# Patient Record
Sex: Female | Born: 1965
Health system: Southern US, Community
[De-identification: ages and names within clinical notes are randomized; demographics above are authoritative.]

## PROBLEM LIST (undated history)

## (undated) HISTORY — PX: ROTATOR CUFF REPAIR: SHX139

---

## 2003-11-11 HISTORY — PX: AUGMENTATION MAMMAPLASTY: SUR837

## 2008-01-18 ENCOUNTER — Encounter: Admission: RE | Admit: 2008-01-18 | Discharge: 2008-01-18 | Payer: Self-pay | Admitting: Obstetrics and Gynecology

## 2009-03-06 ENCOUNTER — Encounter: Admission: RE | Admit: 2009-03-06 | Discharge: 2009-03-06 | Payer: Self-pay | Admitting: Obstetrics and Gynecology

## 2010-03-07 ENCOUNTER — Encounter: Admission: RE | Admit: 2010-03-07 | Discharge: 2010-03-07 | Payer: Self-pay | Admitting: Obstetrics and Gynecology

## 2011-03-17 ENCOUNTER — Other Ambulatory Visit: Payer: Self-pay | Admitting: Obstetrics and Gynecology

## 2011-03-17 DIAGNOSIS — Z1231 Encounter for screening mammogram for malignant neoplasm of breast: Secondary | ICD-10-CM

## 2011-03-25 ENCOUNTER — Ambulatory Visit
Admission: RE | Admit: 2011-03-25 | Discharge: 2011-03-25 | Disposition: A | Discharge status: Home / Self Care | Payer: 59 | Source: Ambulatory Visit | Attending: Obstetrics and Gynecology | Admitting: Obstetrics and Gynecology

## 2011-03-25 DIAGNOSIS — Z1231 Encounter for screening mammogram for malignant neoplasm of breast: Secondary | ICD-10-CM

## 2012-02-16 ENCOUNTER — Other Ambulatory Visit: Payer: Self-pay | Admitting: Obstetrics and Gynecology

## 2012-02-16 DIAGNOSIS — Z1231 Encounter for screening mammogram for malignant neoplasm of breast: Secondary | ICD-10-CM

## 2012-03-31 ENCOUNTER — Ambulatory Visit
Admission: RE | Admit: 2012-03-31 | Discharge: 2012-03-31 | Disposition: A | Discharge status: Home / Self Care | Payer: 59 | Source: Ambulatory Visit | Attending: Obstetrics and Gynecology | Admitting: Obstetrics and Gynecology

## 2012-03-31 DIAGNOSIS — Z1231 Encounter for screening mammogram for malignant neoplasm of breast: Secondary | ICD-10-CM

## 2012-04-02 ENCOUNTER — Other Ambulatory Visit: Payer: Self-pay | Admitting: Obstetrics and Gynecology

## 2012-04-02 DIAGNOSIS — R928 Other abnormal and inconclusive findings on diagnostic imaging of breast: Secondary | ICD-10-CM

## 2012-04-14 ENCOUNTER — Ambulatory Visit
Admission: RE | Admit: 2012-04-14 | Discharge: 2012-04-14 | Disposition: A | Discharge status: Home / Self Care | Payer: 59 | Source: Ambulatory Visit | Attending: Obstetrics and Gynecology | Admitting: Obstetrics and Gynecology

## 2012-04-14 DIAGNOSIS — R928 Other abnormal and inconclusive findings on diagnostic imaging of breast: Secondary | ICD-10-CM

## 2013-03-09 ENCOUNTER — Other Ambulatory Visit: Payer: Self-pay

## 2013-03-09 DIAGNOSIS — Z1231 Encounter for screening mammogram for malignant neoplasm of breast: Secondary | ICD-10-CM

## 2013-05-19 ENCOUNTER — Ambulatory Visit
Admission: RE | Admit: 2013-05-19 | Discharge: 2013-05-19 | Disposition: A | Discharge status: Home / Self Care | Payer: 59 | Source: Ambulatory Visit

## 2013-05-19 DIAGNOSIS — Z1231 Encounter for screening mammogram for malignant neoplasm of breast: Secondary | ICD-10-CM

## 2014-04-14 ENCOUNTER — Other Ambulatory Visit: Payer: Self-pay

## 2014-04-14 DIAGNOSIS — Z9882 Breast implant status: Secondary | ICD-10-CM

## 2014-04-14 DIAGNOSIS — Z1231 Encounter for screening mammogram for malignant neoplasm of breast: Secondary | ICD-10-CM

## 2014-05-22 ENCOUNTER — Ambulatory Visit
Admission: RE | Admit: 2014-05-22 | Discharge: 2014-05-22 | Disposition: A | Discharge status: Home / Self Care | Payer: 59 | Source: Ambulatory Visit

## 2014-05-22 DIAGNOSIS — Z9882 Breast implant status: Secondary | ICD-10-CM

## 2014-05-22 DIAGNOSIS — Z1231 Encounter for screening mammogram for malignant neoplasm of breast: Secondary | ICD-10-CM

## 2015-04-16 ENCOUNTER — Other Ambulatory Visit: Payer: Self-pay

## 2015-04-16 DIAGNOSIS — Z1231 Encounter for screening mammogram for malignant neoplasm of breast: Secondary | ICD-10-CM

## 2015-05-25 ENCOUNTER — Ambulatory Visit
Admission: RE | Admit: 2015-05-25 | Discharge: 2015-05-25 | Disposition: A | Discharge status: Home / Self Care | Payer: 59 | Source: Ambulatory Visit

## 2015-05-25 DIAGNOSIS — Z1231 Encounter for screening mammogram for malignant neoplasm of breast: Secondary | ICD-10-CM

## 2016-10-30 LAB — HM COLONOSCOPY

## 2016-11-15 ENCOUNTER — Encounter (HOSPITAL_COMMUNITY): Payer: Self-pay | Admitting: Family Medicine

## 2016-11-15 ENCOUNTER — Emergency Department (HOSPITAL_COMMUNITY)
Admission: EM | Admit: 2016-11-15 | Discharge: 2016-11-15 | Disposition: A | Discharge status: Home / Self Care | Payer: Managed Care, Other (non HMO) | Attending: Emergency Medicine | Admitting: Emergency Medicine

## 2016-11-15 DIAGNOSIS — R197 Diarrhea, unspecified: Secondary | ICD-10-CM | POA: Insufficient documentation

## 2016-11-15 DIAGNOSIS — R112 Nausea with vomiting, unspecified: Secondary | ICD-10-CM | POA: Diagnosis present

## 2016-11-15 DIAGNOSIS — T50905A Adverse effect of unspecified drugs, medicaments and biological substances, initial encounter: Secondary | ICD-10-CM

## 2016-11-15 DIAGNOSIS — T887XXA Unspecified adverse effect of drug or medicament, initial encounter: Secondary | ICD-10-CM | POA: Diagnosis not present

## 2016-11-15 DIAGNOSIS — T360X5A Adverse effect of penicillins, initial encounter: Secondary | ICD-10-CM | POA: Diagnosis not present

## 2016-11-15 DIAGNOSIS — Z87891 Personal history of nicotine dependence: Secondary | ICD-10-CM | POA: Insufficient documentation

## 2016-11-15 DIAGNOSIS — Y69 Unspecified misadventure during surgical and medical care: Secondary | ICD-10-CM | POA: Diagnosis not present

## 2016-11-15 DIAGNOSIS — R1110 Vomiting, unspecified: Secondary | ICD-10-CM

## 2016-11-15 LAB — I-STAT CHEM 8, ED
BUN: 14 mg/dL (ref 6–20)
CALCIUM ION: 1.12 mmol/L — AB (ref 1.15–1.40)
CHLORIDE: 106 mmol/L (ref 101–111)
Creatinine, Ser: 0.8 mg/dL (ref 0.44–1.00)
GLUCOSE: 137 mg/dL — AB (ref 65–99)
HCT: 39 % (ref 36.0–46.0)
Hemoglobin: 13.3 g/dL (ref 12.0–15.0)
POTASSIUM: 3.9 mmol/L (ref 3.5–5.1)
Sodium: 140 mmol/L (ref 135–145)
TCO2: 23 mmol/L (ref 0–100)

## 2016-11-15 MED ORDER — ONDANSETRON 4 MG PO TBDP: 4.0000 mg | ORAL_TABLET | Freq: Three times a day (TID) | ORAL | 0 refills | Status: DC | PRN

## 2016-11-15 MED ORDER — ONDANSETRON HCL 4 MG/2ML IJ SOLN
4.0000 mg | Freq: Once | INTRAMUSCULAR | Status: AC
  Administered 2016-11-15: 4 mg via INTRAVENOUS
  Filled 2016-11-15: qty 2

## 2016-11-15 MED ORDER — SODIUM CHLORIDE 0.9 % IV BOLUS (SEPSIS)
1000.0000 mL | INTRAVENOUS | Status: AC
  Administered 2016-11-15: 1000 mL via INTRAVENOUS

## 2016-11-15 MED ORDER — DOXYCYCLINE HYCLATE 100 MG PO CAPS: 100.0000 mg | ORAL_CAPSULE | Freq: Two times a day (BID) | ORAL | 0 refills | Status: DC

## 2016-11-15 NOTE — ED Triage Notes (Signed)
Patient is from home and [facility]. Patient reported to EMS she thinks she is experiencing a GI reaction to AMOXICILLIN. Pt is taking a amoxicillin for a URI and the last time she took medication, it caused a reaction. Pt has had ortho change with EMS. EMS reports when they arrived patient was pale, weak, having active vomiting and diarrhea. EMS adminstered ZOFRAN 4mg , BENADRYL 50mg , and NORMAL SALINE.

## 2016-11-15 NOTE — Discharge Instructions (Signed)
Discontinue Augmentin. Take doxycycline instead for sinus infection. Do not take on an empty stomach. Take with a full glass of water or milk. Take Zofran for nausea/vomiting. Follow up with your primary care doctor, as needed.

## 2016-11-15 NOTE — ED Provider Notes (Signed)
[facility]-EMERGENCY DEPT Provider Note   CSN: [id number] Arrival date & time: 11/15/16  1828  By signing my name below, I, Linna DarnerRussell Turner, attest that this documentation has been prepared under the direction and in the presence [employer]Kelly Douglas Rooks, PA-C. Electronically Signed: Linna Darnerussell Turner, Scribe. 11/15/2016. 8:33 PM.   History   Chief Complaint Chief Complaint  Patient presents with  . Emesis  . Diarrhea   The history is provided by the patient. No language interpreter was used.     HPI Comments: Renee Black is a 51 y.o. female brought in by EMS who presents to the Emergency Department complaining of persistent, improving, nausea and vomiting beginning around 3 PM this afternoon. She reports associated diarrhea and abdominal pain. Pt reports she began taking Augmentin today for a sinus infection and had an adverse reaction to it about an hour after the first dose. She reports ~ 10 episodes of vomiting and diarrhea since onset. Pt was given Zofran 4 mg and Benadryl 50 mg by EMS en route tonight which improved her symptoms; she states she feels better currently but is still nauseous. She notes she has taken Augmentin in the past without issue. She denies hematemesis, hematochezia, or any other associated symptoms.   History reviewed. No pertinent past medical history.  There are no active problems to display for this patient.   History reviewed. No pertinent surgical history.  OB History    No data available       Home Medications    Prior to Admission medications   Medication Sig Start Date End Date Taking? Authorizing Provider  fluticasone (FLONASE) 50 MCG/ACT nasal spray Place 2 sprays into both nostrils daily.   Yes Historical Provider, MD  ranitidine (ZANTAC) 150 MG tablet Take 150 mg by mouth 2 (two) times daily.   Yes Historical Provider, MD  valACYclovir (VALTREX) 1000 MG tablet Take 2 g by mouth once.    Yes Historical Provider, MD  doxycycline (VIBRAMYCIN) 100 MG capsule Take 1  capsule (100 mg total) by mouth 2 (two) times daily. 11/15/16   Antony MaduraKelly Franky Reier, PA-C  ondansetron (ZOFRAN ODT) 4 MG disintegrating tablet Take 1 tablet (4 mg total) by mouth every 8 (eight) hours as needed for nausea or vomiting. 11/15/16   Antony MaduraKelly Pilar Westergaard, PA-C    Family History History reviewed. No pertinent family history.  Social History Social History  Substance Use Topics  . Smoking status: Former Smoker    Types: Cigarettes  . Smokeless tobacco: Never Used     Comment: Stopped 20 years ago  . Alcohol use Yes     Comment: Wine daily.      Allergies   Augmentin [amoxicillin-pot clavulanate]; Hydrocortisone; and Sulfa antibiotics   Review of Systems Review of Systems A complete 10 system review of systems was obtained and all systems are negative except as noted in the HPI and PMH.    Physical Exam Updated Vital Signs BP 116/76 (BP Location: Right Arm)   Pulse 110   Temp 97.9 F (36.6 C) (Oral)   Resp 18   Ht 5\' 7"  (1.702 m)   Wt 64.4 kg   LMP 11/14/2016   SpO2 100%   BMI 22.24 kg/m   Physical Exam  Constitutional: She is oriented to person, place, and time. She appears well-developed and well-nourished. No distress.  Nontoxic and in no distress  HENT:  Head: Normocephalic and atraumatic.  Eyes: Conjunctivae and EOM are normal. No scleral icterus.  Neck: Normal range of motion.  Cardiovascular:  Normal rate, regular rhythm and intact distal pulses.   Pulmonary/Chest: Effort normal. No respiratory distress. She has no wheezes. She has no rales.  Respirations even and unlabored  Abdominal: Soft. She exhibits no distension and no mass. There is no tenderness. There is no guarding.  Soft, nontender, nondistended abdomen. No masses or peritoneal signs.  Musculoskeletal: Normal range of motion.  Neurological: She is alert and oriented to person, place, and time. She exhibits normal muscle tone. Coordination normal.  GCS 15. Patient moving all extremities.  Skin: Skin is  warm and dry. No rash noted. She is not diaphoretic. No erythema. No pallor.  Psychiatric: She has a normal mood and affect. Her behavior is normal.  Nursing note and vitals reviewed.    ED Treatments / Results  Labs (all labs ordered are listed, but only abnormal results are displayed) Labs Reviewed  I-STAT CHEM 8, ED - Abnormal; Notable for the following:       Result Value   Glucose, Bld 137 (*)    Calcium, Ion 1.12 (*)    All other components within normal limits    EKG  EKG Interpretation None       Radiology No results found.  Procedures Procedures (including critical care time)  DIAGNOSTIC STUDIES: Oxygen Saturation is 99% on RA, normal by my interpretation.    COORDINATION OF CARE: 8:38 PM Discussed treatment plan with pt at bedside and pt agreed to plan.  Medications Ordered in ED Medications  sodium chloride 0.9 % bolus 1,000 mL (0 mLs Intravenous Stopped 11/15/16 2159)  ondansetron (ZOFRAN) injection 4 mg (4 mg Intravenous Given 11/15/16 2057)     Initial Impression / Assessment and Plan / ED Course  I have reviewed the triage vital signs and the nursing notes.  Pertinent labs & imaging results that were available during my care of the patient were reviewed by me and considered in my medical decision making (see chart for details).  Clinical Course     51 year old female presents to the emergency department for evaluation of vomiting and diarrhea which began one hour after taking Augmentin for a sinus infection. Patient has a history of upset 7 and diarrhea after taking amoxicillin in the past. Patient denies abdominal pain. She is requesting ginger ale which she tolerated well. Additional Zofran given for residual nausea. Patient has received IV fluids from EMS and one bolus of fluid while in the department. She reports that she feels much improved.   The patient is requesting discharge. Patient is borderline orthostatic, but denies any lightheadedness  upon standing. No near syncope. She has been up and ambulatory to the bathroom without difficulty multiple times since arrival. I believe that discharge and outpatient management is reasonable. The patient has been advised to discontinue Augmentin. Will switch to doxycycline. Zofran given for nausea and return precautions provided. Patient discharged in stable condition with no unaddressed concerns.   Final Clinical Impressions(s) / ED Diagnoses   Final diagnoses:  Vomiting and diarrhea  Medication reaction, initial encounter    New Prescriptions Discharge Medication List as of 11/15/2016  9:17 PM    START taking these medications   Details  doxycycline (VIBRAMYCIN) 100 MG capsule Take 1 capsule (100 mg total) by mouth 2 (two) times daily., Starting Sat 11/15/2016, Print    ondansetron (ZOFRAN ODT) 4 MG disintegrating tablet Take 1 tablet (4 mg total) by mouth every 8 (eight) hours as needed for nausea or vomiting., Starting Sat 11/15/2016, Print  I personally performed the services described in this documentation, which was scribed in my presence. The recorded information has been reviewed and is accurate.      Antony Madura, PA-C 11/15/16 2223    Tilden Fossa, MD 11/16/16 [phone]

## 2016-12-15 ENCOUNTER — Other Ambulatory Visit: Payer: Self-pay | Admitting: Obstetrics and Gynecology

## 2016-12-15 DIAGNOSIS — Z1231 Encounter for screening mammogram for malignant neoplasm of breast: Secondary | ICD-10-CM

## 2016-12-18 ENCOUNTER — Ambulatory Visit
Admission: RE | Admit: 2016-12-18 | Discharge: 2016-12-18 | Disposition: A | Discharge status: Home / Self Care | Payer: Managed Care, Other (non HMO) | Source: Ambulatory Visit | Attending: Obstetrics and Gynecology | Admitting: Obstetrics and Gynecology

## 2016-12-18 ENCOUNTER — Other Ambulatory Visit: Payer: Self-pay | Admitting: Obstetrics and Gynecology

## 2016-12-18 ENCOUNTER — Ambulatory Visit: Payer: 59

## 2016-12-18 DIAGNOSIS — Z1231 Encounter for screening mammogram for malignant neoplasm of breast: Secondary | ICD-10-CM

## 2016-12-18 LAB — HM MAMMOGRAPHY: HM Mammogram: NORMAL (ref 0–4)

## 2017-01-13 ENCOUNTER — Ambulatory Visit: Payer: 59

## 2017-06-25 LAB — HM PAP SMEAR

## 2017-10-15 ENCOUNTER — Telehealth: Payer: Self-pay | Admitting: General Practice

## 2017-10-15 NOTE — Telephone Encounter (Signed)
Pt called in to establish care with provider. She said that 2 of her friends are current patients so she has heard amazing things about provider. Current pt's are Burman FosterJill Mason and also [employer] Baugh.    Dr. Karie Schwalbe, will you take pt on as new patient   CB: for scheduling if so, (front office) [phone]

## 2017-10-15 NOTE — Telephone Encounter (Signed)
Ok to establish in next available.

## 2017-10-16 NOTE — Telephone Encounter (Signed)
Pt has been scheduled.  °

## 2017-12-02 ENCOUNTER — Ambulatory Visit (INDEPENDENT_AMBULATORY_CARE_PROVIDER_SITE_OTHER): Payer: 59 | Admitting: Family Medicine

## 2017-12-02 ENCOUNTER — Encounter: Payer: Self-pay | Admitting: Family Medicine

## 2017-12-02 ENCOUNTER — Other Ambulatory Visit: Payer: Self-pay

## 2017-12-02 VITALS — BP 120/80 | HR 113 | Temp 98.0°F | Resp 16 | Ht 69.0 in | Wt 151.1 lb

## 2017-12-02 DIAGNOSIS — Z0000 Encounter for general adult medical examination without abnormal findings: Secondary | ICD-10-CM

## 2017-12-02 DIAGNOSIS — E785 Hyperlipidemia, unspecified: Secondary | ICD-10-CM

## 2017-12-02 LAB — CBC WITH DIFFERENTIAL/PLATELET
BASOS ABS: 0 10*3/uL (ref 0.0–0.1)
Basophils Relative: 0.4 % (ref 0.0–3.0)
Eosinophils Absolute: 0 10*3/uL (ref 0.0–0.7)
Eosinophils Relative: 0.4 % (ref 0.0–5.0)
HEMATOCRIT: 39.5 % (ref 36.0–46.0)
Hemoglobin: 13.2 g/dL (ref 12.0–15.0)
LYMPHS ABS: 2.1 10*3/uL (ref 0.7–4.0)
Lymphocytes Relative: 27.1 % (ref 12.0–46.0)
MCHC: 33.4 g/dL (ref 30.0–36.0)
MCV: 91.7 fl (ref 78.0–100.0)
MONOS PCT: 7.6 % (ref 3.0–12.0)
Monocytes Absolute: 0.6 10*3/uL (ref 0.1–1.0)
NEUTROS ABS: 4.9 10*3/uL (ref 1.4–7.7)
NEUTROS PCT: 64.5 % (ref 43.0–77.0)
Platelets: 326 10*3/uL (ref 150.0–400.0)
RBC: 4.3 Mil/uL (ref 3.87–5.11)
RDW: 13.6 % (ref 11.5–15.5)
WBC: 7.6 10*3/uL (ref 4.0–10.5)

## 2017-12-02 LAB — LIPID PANEL
CHOLESTEROL: 233 mg/dL — AB (ref 0–200)
HDL: 110.5 mg/dL (ref >39.00)
LDL Cholesterol: 94 mg/dL (ref 0–99)
NonHDL: 122.17
Total CHOL/HDL Ratio: 2
Triglycerides: 143 mg/dL (ref 0.0–149.0)
VLDL: 28.6 mg/dL (ref 0.0–40.0)

## 2017-12-02 LAB — HEPATIC FUNCTION PANEL
ALK PHOS: 46 U/L (ref 39–117)
ALT: 12 U/L (ref 0–35)
AST: 14 U/L (ref 0–37)
Albumin: 4.2 g/dL (ref 3.5–5.2)
BILIRUBIN DIRECT: 0.1 mg/dL (ref 0.0–0.3)
BILIRUBIN TOTAL: 0.3 mg/dL (ref 0.2–1.2)
TOTAL PROTEIN: 7 g/dL (ref 6.0–8.3)

## 2017-12-02 LAB — BASIC METABOLIC PANEL
BUN: 16 mg/dL (ref 6–23)
CO2: 25 meq/L (ref 19–32)
Calcium: 9.9 mg/dL (ref 8.4–10.5)
Chloride: 102 mEq/L (ref 96–112)
Creatinine, Ser: 0.8 mg/dL (ref 0.40–1.20)
GFR: 79.85 mL/min (ref >60.00)
GLUCOSE: 89 mg/dL (ref 70–99)
POTASSIUM: 4.2 meq/L (ref 3.5–5.1)
SODIUM: 139 meq/L (ref 135–145)

## 2017-12-02 LAB — TSH: TSH: 1.45 u[IU]/mL (ref 0.35–4.50)

## 2017-12-02 NOTE — Progress Notes (Signed)
   Subjective:    Patient ID: Renee Black, female    DOB: 1966-07-23, 52 y.o.   MRN: [id number]  HPI New to establish.  Previous MD- Abigail Miyamotohacker.  GYN -[facility]on.  GI- [facility].  UTD on mammo, colonoscopy, pap.  UTD on flu.  No concerns today.   Review of Systems Patient reports no vision/ hearing changes, adenopathy,fever, weight change,  persistant/recurrent hoarseness , swallowing issues, chest pain, palpitations, edema, persistant/recurrent cough, hemoptysis, dyspnea (rest/exertional/paroxysmal nocturnal), gastrointestinal bleeding (melena, rectal bleeding), abdominal pain, significant heartburn, bowel changes, GU symptoms (dysuria, hematuria, incontinence), Gyn symptoms (abnormal  bleeding, pain),  syncope, focal weakness, memory loss, numbness & tingling, skin/hair/nail changes, abnormal bruising or bleeding, anxiety, or depression.     Objective:   Physical Exam General Appearance:    Alert, cooperative, no distress, appears stated age  Head:    Normocephalic, without obvious abnormality, atraumatic  Eyes:    PERRL, conjunctiva/corneas clear, EOM's intact, fundi    benign, both eyes  Ears:    Normal TM's and external ear canals, both ears  Nose:   Nares normal, septum midline, mucosa normal, no drainage    or sinus tenderness  Throat:   Lips, mucosa, and tongue normal; teeth and gums normal  Neck:   Supple, symmetrical, trachea midline, no adenopathy;    Thyroid: no enlargement/tenderness/nodules  Back:     Symmetric, no curvature, ROM normal, no CVA tenderness  Lungs:     Clear to auscultation bilaterally, respirations unlabored  Chest Wall:    No tenderness or deformity   Heart:    Regular rate and rhythm, S1 and S2 normal, no murmur, rub   or gallop  Breast Exam:    Deferred to GYN  Abdomen:     Soft, non-tender, bowel sounds active all four quadrants,    no masses, no organomegaly  Genitalia:    Deferred to GYN  Rectal:    Extremities:   Extremities normal, atraumatic, no cyanosis or  edema  Pulses:   2+ and symmetric all extremities  Skin:   Skin color, texture, turgor normal, no rashes or lesions  Lymph nodes:   Cervical, supraclavicular, and axillary nodes normal  Neurologic:   CNII-XII intact, normal strength, sensation and reflexes    throughout          Assessment & Plan:

## 2017-12-02 NOTE — Assessment & Plan Note (Signed)
Pt's PE WNL.  UTD on Tdap and flu.  UTD on GYN and colonoscopy.  Check labs.  Anticipatory guidance provided.

## 2017-12-02 NOTE — Patient Instructions (Signed)
Follow up in 1 year or as needed We'll notify you of your lab results and make any changes if needed Keep up the good work!  You look great! Call with any questions or concerns Welcome!  We're glad to have you! 

## 2017-12-02 NOTE — Assessment & Plan Note (Signed)
Pt's last total cholesterol was 244 but this is b/c her HDL was 101.  Check labs.

## 2017-12-03 ENCOUNTER — Encounter: Payer: Self-pay | Admitting: General Practice

## 2017-12-10 ENCOUNTER — Other Ambulatory Visit: Payer: Self-pay | Admitting: Obstetrics and Gynecology

## 2017-12-10 DIAGNOSIS — Z1231 Encounter for screening mammogram for malignant neoplasm of breast: Secondary | ICD-10-CM

## 2017-12-30 ENCOUNTER — Ambulatory Visit
Admission: RE | Admit: 2017-12-30 | Discharge: 2017-12-30 | Disposition: A | Discharge status: Home / Self Care | Payer: 59 | Source: Ambulatory Visit | Attending: Obstetrics and Gynecology | Admitting: Obstetrics and Gynecology

## 2017-12-30 DIAGNOSIS — Z1231 Encounter for screening mammogram for malignant neoplasm of breast: Secondary | ICD-10-CM

## 2018-06-02 ENCOUNTER — Telehealth: Payer: Self-pay | Admitting: Family Medicine

## 2018-06-02 NOTE — Telephone Encounter (Signed)
Patient returned call, advised of note below about the Valtrex refill by Marcelline MatesWilliam Martin, PA-C on 06/02/18, patient verbalized understanding. She says she doesn't have a flare up now, but this is something she has been on for a long time and since she ran out, she figured she would call for the refill. She asks would it be better to wait until she has a flare up or go ahead and be seen, so it will be documented in her chart. I advised either way, it is up to her. She agreed to an appointment to have this noted in her chart, appointment scheduled for Friday, 06/25/18 at 1315 with Dr. Beverely Lowabori.

## 2018-06-02 NOTE — Telephone Encounter (Signed)
Last OV 12/02/17, No future OV  Last filled by historical provider  Please advise if okay to fill?

## 2018-06-02 NOTE — Telephone Encounter (Signed)
I do not see any discussion about prior diagnosis for the medication.  Patient would need assessment or wait until PCP returns.

## 2018-06-02 NOTE — Telephone Encounter (Signed)
Copied from CRM [id number]. Topic: Quick Communication - Rx Refill/Question >> Jun 02, 2018 10:36 AM Alexander BergeronBarksdale, Harvey B wrote: Medication: valACYclovir (VALTREX) 1000 MG tablet [[id number]][[id number]]   Has the patient contacted their pharmacy? Yes.   (Agent: If no, request that the patient contact the pharmacy for the refill.) (Agent: If yes, when and what did the pharmacy advise?)  Preferred Pharmacy (with phone number or street [facility]   Agent: Please be advised that RX refills may take up to 3 business days. We ask that you follow-up with your pharmacy.

## 2018-06-02 NOTE — Telephone Encounter (Signed)
Called patient and LMOVM to return call  [facility] to [facility] results / PCP / recommendations / Schedule patient  Please let patient know that if she needs the Valtrex now then she will need [facility] since we do not have any notes of prescribing this medication since Dr. Beverely Lowabori is out of the office or she can wait until Dr. Beverely Lowabori returns to the office.   CRM Created.

## 2018-06-25 ENCOUNTER — Ambulatory Visit (INDEPENDENT_AMBULATORY_CARE_PROVIDER_SITE_OTHER): Payer: 59 | Admitting: Family Medicine

## 2018-06-25 ENCOUNTER — Encounter: Payer: Self-pay | Admitting: Family Medicine

## 2018-06-25 ENCOUNTER — Other Ambulatory Visit: Payer: Self-pay

## 2018-06-25 DIAGNOSIS — B009 Herpesviral infection, unspecified: Secondary | ICD-10-CM | POA: Diagnosis not present

## 2018-06-25 MED ORDER — VALACYCLOVIR HCL 1 G PO TABS: 2.0000 g | ORAL_TABLET | Freq: Once | ORAL | 1 refills | Status: AC

## 2018-06-25 NOTE — Patient Instructions (Signed)
Schedule your complete physical for after 12/02/18 Take the Valtrex as needed for oral sore Call with any questions or concerns Enjoy the rest of your summer!!!

## 2018-06-25 NOTE — Progress Notes (Signed)
   Subjective:    Patient ID: Renee Black, female    DOB: [birth year], 52 y.o.   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  Always occurs in same spot- R lower back molar.  Pt had talked to her dentist who used to cauterize the area and prescribe Valtrex.  Pt finds that when she takes the 2 gram Valtrex x1 dose at onset, cycle is shorter and less severe.  No current sxs.   Review of Systems For ROS see HPI     Objective:   Physical Exam  Constitutional: She is oriented to person, place, and time. She appears well-developed and well-nourished. No distress.  HENT:  Head: Normocephalic and atraumatic.  Mouth/Throat: Oropharynx is clear and moist.  No sores noted  Lymphadenopathy:    She has no cervical adenopathy.  Neurological: She is alert and oriented to person, place, and time.  Skin: Skin is warm.  Psychiatric: She has a normal mood and affect. Her behavior is normal. Thought content normal.  Vitals reviewed.         Assessment & Plan:

## 2018-06-25 NOTE — Assessment & Plan Note (Signed)
New.  Pt has hx of similar.  Has been treated effectively w/ Valtrex in the past.  Will provide refills for pt to have on hand.  Pt to call if more refills needed or refills expire.  Pt expressed understanding and is in agreement w/ plan.

## 2018-12-17 ENCOUNTER — Other Ambulatory Visit: Payer: Self-pay | Admitting: Family Medicine

## 2018-12-17 DIAGNOSIS — Z1231 Encounter for screening mammogram for malignant neoplasm of breast: Secondary | ICD-10-CM

## 2018-12-22 ENCOUNTER — Other Ambulatory Visit: Payer: Self-pay

## 2018-12-22 ENCOUNTER — Ambulatory Visit (INDEPENDENT_AMBULATORY_CARE_PROVIDER_SITE_OTHER): Payer: 59 | Admitting: Family Medicine

## 2018-12-22 ENCOUNTER — Encounter: Payer: Self-pay | Admitting: Family Medicine

## 2018-12-22 VITALS — BP 110/82 | HR 76 | Temp 98.1°F | Resp 16 | Ht 69.0 in | Wt 147.2 lb

## 2018-12-22 DIAGNOSIS — E785 Hyperlipidemia, unspecified: Secondary | ICD-10-CM | POA: Diagnosis not present

## 2018-12-22 DIAGNOSIS — Z0000 Encounter for general adult medical examination without abnormal findings: Secondary | ICD-10-CM

## 2018-12-22 DIAGNOSIS — Z23 Encounter for immunization: Secondary | ICD-10-CM | POA: Diagnosis not present

## 2018-12-22 LAB — LIPID PANEL
CHOLESTEROL: 226 mg/dL — AB (ref 0–200)
HDL: 78.6 mg/dL (ref >39.00)
NonHDL: 147.38
Total CHOL/HDL Ratio: 3
Triglycerides: 389 mg/dL — ABNORMAL HIGH (ref 0.0–149.0)
VLDL: 77.8 mg/dL — AB (ref 0.0–40.0)

## 2018-12-22 LAB — CBC WITH DIFFERENTIAL/PLATELET
BASOS ABS: 0 10*3/uL (ref 0.0–0.1)
Basophils Relative: 0.7 % (ref 0.0–3.0)
EOS PCT: 1.1 % (ref 0.0–5.0)
Eosinophils Absolute: 0 10*3/uL (ref 0.0–0.7)
HCT: 35.3 % — ABNORMAL LOW (ref 36.0–46.0)
Hemoglobin: 12 g/dL (ref 12.0–15.0)
LYMPHS ABS: 2.1 10*3/uL (ref 0.7–4.0)
Lymphocytes Relative: 47.1 % — ABNORMAL HIGH (ref 12.0–46.0)
MCHC: 34.1 g/dL (ref 30.0–36.0)
MCV: 91.4 fl (ref 78.0–100.0)
MONO ABS: 0.3 10*3/uL (ref 0.1–1.0)
MONOS PCT: 6.2 % (ref 3.0–12.0)
NEUTROS ABS: 2 10*3/uL (ref 1.4–7.7)
Neutrophils Relative %: 44.9 % (ref 43.0–77.0)
PLATELETS: 298 10*3/uL (ref 150.0–400.0)
RBC: 3.86 Mil/uL — ABNORMAL LOW (ref 3.87–5.11)
RDW: 13.7 % (ref 11.5–15.5)
WBC: 4.3 10*3/uL (ref 4.0–10.5)

## 2018-12-22 LAB — TSH: TSH: 1.81 u[IU]/mL (ref 0.35–4.50)

## 2018-12-22 LAB — HEPATIC FUNCTION PANEL
ALK PHOS: 43 U/L (ref 39–117)
ALT: 11 U/L (ref 0–35)
AST: 17 U/L (ref 0–37)
Albumin: 4.2 g/dL (ref 3.5–5.2)
BILIRUBIN DIRECT: 0.1 mg/dL (ref 0.0–0.3)
TOTAL PROTEIN: 6.5 g/dL (ref 6.0–8.3)
Total Bilirubin: 0.3 mg/dL (ref 0.2–1.2)

## 2018-12-22 LAB — LDL CHOLESTEROL, DIRECT: LDL DIRECT: 79 mg/dL

## 2018-12-22 LAB — BASIC METABOLIC PANEL
BUN: 16 mg/dL (ref 6–23)
CHLORIDE: 103 meq/L (ref 96–112)
CO2: 25 mEq/L (ref 19–32)
Calcium: 9.3 mg/dL (ref 8.4–10.5)
Creatinine, Ser: 0.71 mg/dL (ref 0.40–1.20)
GFR: 85.88 mL/min (ref >60.00)
GLUCOSE: 77 mg/dL (ref 70–99)
POTASSIUM: 4.3 meq/L (ref 3.5–5.1)
SODIUM: 140 meq/L (ref 135–145)

## 2018-12-22 NOTE — Assessment & Plan Note (Signed)
Pt's PE WNL.  UTD on GYN, colonoscopy, flu.  Tdap given today.  Check labs.  Anticipatory guidance provided.

## 2018-12-22 NOTE — Patient Instructions (Signed)
Follow up in 1 year or as needed We'll notify you of your lab results and make any changes if needed Keep up the good work on healthy diet and regular exercise- you look great! Call with any questions or concerns Happy Valentine's Day! 

## 2018-12-22 NOTE — Assessment & Plan Note (Signed)
Check labs and determine if treatment is needed

## 2018-12-22 NOTE — Addendum Note (Signed)
Addended by: Geannie Risen on: 12/22/2018 09:20 AM   Modules accepted: Orders

## 2018-12-22 NOTE — Progress Notes (Signed)
   Subjective:    Patient ID: Renee Black, female    DOB: August 05, 1966, 53 y.o.   MRN: [id number]  HPI CPE- UTD on colonoscopy, mammo, pap.  UTD on flu.  Due for Tdap.   Review of Systems Patient reports no vision/ hearing changes, adenopathy,fever, weight change,  persistant/recurrent hoarseness , swallowing issues, chest pain, palpitations, edema, persistant/recurrent cough, hemoptysis, dyspnea (rest/exertional/paroxysmal nocturnal), gastrointestinal bleeding (melena, rectal bleeding), abdominal pain, significant heartburn, bowel changes, GU symptoms (dysuria, hematuria, incontinence),  syncope, focal weakness, memory loss, numbness & tingling, skin/hair/nail changes, abnormal bruising or bleeding, anxiety, or depression.   + ongoing vaginal issues- seeing GYN    Objective:   Physical Exam General Appearance:    Alert, cooperative, no distress, appears stated age  Head:    Normocephalic, without obvious abnormality, atraumatic  Eyes:    PERRL, conjunctiva/corneas clear, EOM's intact, fundi    benign, both eyes  Ears:    Normal TM's and external ear canals, both ears  Nose:   Nares normal, septum midline, mucosa normal, no drainage    or sinus tenderness  Throat:   Lips, mucosa, and tongue normal; teeth and gums normal  Neck:   Supple, symmetrical, trachea midline, no adenopathy;    Thyroid: no enlargement/tenderness/nodules  Back:     Symmetric, no curvature, ROM normal, no CVA tenderness  Lungs:     Clear to auscultation bilaterally, respirations unlabored  Chest Wall:    No tenderness or deformity   Heart:    Regular rate and rhythm, S1 and S2 normal, no murmur, rub   or gallop  Breast Exam:    Deferred to GYN  Abdomen:     Soft, non-tender, bowel sounds active all four quadrants,    no masses, no organomegaly  Genitalia:    Deferred to GYN  Rectal:    Extremities:   Extremities normal, atraumatic, no cyanosis or edema  Pulses:   2+ and symmetric all extremities  Skin:   Skin  color, texture, turgor normal, no rashes or lesions  Lymph nodes:   Cervical, supraclavicular, and axillary nodes normal  Neurologic:   CNII-XII intact, normal strength, sensation and reflexes    throughout          Assessment & Plan:

## 2019-01-24 ENCOUNTER — Ambulatory Visit: Payer: 59

## 2019-03-07 ENCOUNTER — Ambulatory Visit: Payer: 59

## 2019-04-18 ENCOUNTER — Ambulatory Visit: Payer: 59

## 2019-06-03 ENCOUNTER — Ambulatory Visit: Payer: 59

## 2019-06-03 ENCOUNTER — Other Ambulatory Visit: Payer: Self-pay

## 2019-06-03 ENCOUNTER — Ambulatory Visit
Admission: RE | Admit: 2019-06-03 | Discharge: 2019-06-03 | Disposition: A | Discharge status: Home / Self Care | Payer: 59 | Source: Ambulatory Visit | Attending: Family Medicine | Admitting: Family Medicine

## 2019-06-03 DIAGNOSIS — Z1231 Encounter for screening mammogram for malignant neoplasm of breast: Secondary | ICD-10-CM

## 2019-08-31 ENCOUNTER — Telehealth: Payer: Self-pay | Admitting: Family Medicine

## 2019-08-31 NOTE — Telephone Encounter (Signed)
Would love to have office visit to recheck triglycerides and review other risk factors.  Can then refer to cardiology for stress test (also need baseline EKG)

## 2019-08-31 NOTE — Telephone Encounter (Signed)
Pt called in asking if Birdie Riddle would put in order or referral to have a stress test done. She states that her 54 yr old brother had a heart attack this past weekend and her family has a family history of heart issues. Pt can be reached at the home #

## 2019-08-31 NOTE — Telephone Encounter (Signed)
Can we please schedule pt an appt.

## 2019-08-31 NOTE — Telephone Encounter (Signed)
Please advise, should pt have OV with you?

## 2019-08-31 NOTE — Telephone Encounter (Signed)
Patient has been scheduled for Monday morning.

## 2019-09-05 ENCOUNTER — Ambulatory Visit: Payer: 59 | Admitting: Family Medicine

## 2019-09-07 ENCOUNTER — Ambulatory Visit (INDEPENDENT_AMBULATORY_CARE_PROVIDER_SITE_OTHER): Payer: 59 | Admitting: Family Medicine

## 2019-09-07 ENCOUNTER — Other Ambulatory Visit: Payer: Self-pay

## 2019-09-07 ENCOUNTER — Encounter: Payer: Self-pay | Admitting: Family Medicine

## 2019-09-07 VITALS — BP 121/73 | HR 78 | Temp 97.9°F | Resp 16 | Ht 69.0 in | Wt 150.5 lb

## 2019-09-07 DIAGNOSIS — Z8249 Family history of ischemic heart disease and other diseases of the circulatory system: Secondary | ICD-10-CM | POA: Diagnosis not present

## 2019-09-07 DIAGNOSIS — E782 Mixed hyperlipidemia: Secondary | ICD-10-CM

## 2019-09-07 LAB — LIPID PANEL
Cholesterol: 213 mg/dL — ABNORMAL HIGH (ref 0–200)
HDL: 93.1 mg/dL (ref >39.00)
LDL Cholesterol: 81 mg/dL (ref 0–99)
NonHDL: 120.32
Total CHOL/HDL Ratio: 2
Triglycerides: 199 mg/dL — ABNORMAL HIGH (ref 0.0–149.0)
VLDL: 39.8 mg/dL (ref 0.0–40.0)

## 2019-09-07 LAB — BASIC METABOLIC PANEL
BUN: 9 mg/dL (ref 6–23)
CO2: 28 mEq/L (ref 19–32)
Calcium: 9.5 mg/dL (ref 8.4–10.5)
Chloride: 101 mEq/L (ref 96–112)
Creatinine, Ser: 0.7 mg/dL (ref 0.40–1.20)
GFR: 87.06 mL/min (ref >60.00)
Glucose, Bld: 98 mg/dL (ref 70–99)
Potassium: 4 mEq/L (ref 3.5–5.1)
Sodium: 137 mEq/L (ref 135–145)

## 2019-09-07 LAB — HEPATIC FUNCTION PANEL
ALT: 11 U/L (ref 0–35)
AST: 16 U/L (ref 0–37)
Albumin: 4.3 g/dL (ref 3.5–5.2)
Alkaline Phosphatase: 44 U/L (ref 39–117)
Bilirubin, Direct: 0.1 mg/dL (ref 0.0–0.3)
Total Bilirubin: 0.4 mg/dL (ref 0.2–1.2)
Total Protein: 6.6 g/dL (ref 6.0–8.3)

## 2019-09-07 NOTE — Assessment & Plan Note (Signed)
Pt has hx of this but not currently on medication due to low LDL.  Had long discussion w/ pt about the benefits of statins- lipid lowering, anti-inflammatory, and plaque stabilization.  Once her labs are back, we will start the appropriate dose.  Pt expressed understanding and is in agreement w/ plan.

## 2019-09-07 NOTE — Assessment & Plan Note (Signed)
New.  Baseline EKG WNL.  Pt currently asymptomatic.  But 54 year old very healthy and fit brother just had MI.  Discussed starting 81mg  ASA and statin.  Will refer to Cards for likely stress test and complete evaluation.  Pt expressed understanding and is in agreement w/ plan.

## 2019-09-07 NOTE — Progress Notes (Signed)
   Subjective:    Patient ID: Renee Black, female    DOB: [birth year], 53 y.o.   MRN: [id number]  HPI Family history of early CAD- brother had MI at age 42, 'healthy and in good shape'.  Was actually mountain biking when he had MI and had CPE the week prior.  He had '100% blockage' when in cath lab.  PGM died of MI, dad had stent, uncle had bypass.  Pt has elevated total cholesterol- but HDL is great at 78.  Trigs were elevated at 389.  3 of 4 brothers have elevated cholesterol, 4th is going to have labs done.   Review of Systems For ROS see HPI     Objective:   Physical Exam Vitals signs reviewed.  Constitutional:      General: She is not in acute distress.    Appearance: Normal appearance. She is well-developed. She is not ill-appearing.  HENT:     Head: Normocephalic and atraumatic.  Eyes:     Conjunctiva/sclera: Conjunctivae normal.     Pupils: Pupils are equal, round, and reactive to light.  Neck:     Musculoskeletal: Normal range of motion and neck supple.     Thyroid: No thyromegaly.  Cardiovascular:     Rate and Rhythm: Normal rate and regular rhythm.     Heart sounds: Normal heart sounds. No murmur.  Pulmonary:     Effort: Pulmonary effort is normal. No respiratory distress.     Breath sounds: Normal breath sounds.  Abdominal:     General: There is no distension.     Palpations: Abdomen is soft.     Tenderness: There is no abdominal tenderness.  Lymphadenopathy:     Cervical: No cervical adenopathy.  Skin:    General: Skin is warm and dry.  Neurological:     Mental Status: She is alert and oriented to person, place, and time.  Psychiatric:        Behavior: Behavior normal.           Assessment & Plan:

## 2019-09-07 NOTE — Patient Instructions (Signed)
Follow up as needed or as scheduled We'll notify you of your lab results and make any changes if needed START 81mg  enteric coated aspirin Once we have the cholesterol results we'll decide on the right medication dose Keep up the good work on healthy diet and regular exercise- you look great! Call with any questions or concerns Stay Safe!  Stay Healthy!!!

## 2019-09-08 ENCOUNTER — Other Ambulatory Visit: Payer: Self-pay | Admitting: General Practice

## 2019-09-08 ENCOUNTER — Encounter: Payer: Self-pay | Admitting: Family Medicine

## 2019-09-08 DIAGNOSIS — E785 Hyperlipidemia, unspecified: Secondary | ICD-10-CM

## 2019-09-08 MED ORDER — ATORVASTATIN CALCIUM 10 MG PO TABS: 10.0000 mg | ORAL_TABLET | Freq: Every day | ORAL | 1 refills | Status: DC

## 2019-09-13 ENCOUNTER — Encounter: Payer: Self-pay | Admitting: Family Medicine

## 2019-09-13 NOTE — Progress Notes (Signed)
Cardiology Office Note:    Date:  09/14/2019   ID:  Renee Black, DOB [birth year], MRN [id number]  PCP:  Renee Minium, MD  Cardiologist:  No primary care provider on file.   Referring MD: Renee Minium, MD   Chief Complaint  Patient presents with  . Advice Only    Family history premature CAD  . Hyperlipidemia    History of Present Illness:    Renee Black is a 53 y.o. female with a hx OF HYPERLIPIDEMIA, former smoker, and family h/o premature CAD. Referred for cardiology consultation by Renee Black.  She has a strong family history of premature coronary artery disease which included 3 paternal uncles who all had CABG, her father who had a stent at age 41, and her 47 year old brother who had a stent earlier this year.  She is relatively sedentary.  She does not get regular aerobic activity.  Denies cardiovascular complaints including chest discomfort, dyspnea, palpitations, ankle swelling, orthopnea, and PND.  Had evaluation with Renee Black earlier this fall, statin therapy was started.  Baseline LDL was 81.  Follow-up blood work is not been done.  Triglyceride was 199.  Atorvastatin 10 mg/day was initiated and is being well-tolerated.  History reviewed. No pertinent past medical history.  Past Surgical History:  Procedure Laterality Date  . AUGMENTATION MAMMAPLASTY Bilateral 2005  . ROTATOR CUFF REPAIR      Current Medications: Current Meds  Medication Sig  . ADDYI 100 MG TABS Take 1 tablet by mouth at bedtime.  Marland Kitchen aspirin (ASPIRIN 81) 81 MG chewable tablet Chew 1 mg by mouth daily.  Marland Kitchen atorvastatin (LIPITOR) 10 MG tablet Take 1 tablet (10 mg total) by mouth daily.  . fluconazole (DIFLUCAN) 150 MG tablet Take 150 mg by mouth every other day.  . fluticasone (FLONASE) 50 MCG/ACT nasal spray Place 2 sprays into both nostrils daily.  Marland Kitchen ibuprofen (ADVIL,MOTRIN) 800 MG tablet ibuprofen 800 mg tablet  . Multiple Vitamins-Minerals (WOMENS MULTIVITAMIN PO) Take by  mouth.  Marland Kitchen PREMARIN vaginal cream Place 0.5 g vaginally 2 (two) times a week.  . valACYclovir (VALTREX) 1000 MG tablet TAKE 2 TABLETS BY MOUTH EVERY TWELVE HOURS FOR 1 DAY AT ONSET OF FEVER blister symptoms     Allergies:   Augmentin [amoxicillin-pot clavulanate], Codeine, Hydrocortisone, and Sulfa antibiotics   Social History   Socioeconomic History  . Marital status: Married    Spouse name: Not on file  . Number of children: Not on file  . Years of education: Not on file  . Highest education level: Not on file  Occupational History  . Not on file  Social Needs  . Financial resource strain: Not on file  . Food insecurity    Worry: Not on file    Inability: Not on file  . Transportation needs    Medical: Not on file    Non-medical: Not on file  Tobacco Use  . Smoking status: Former Smoker    Types: Cigarettes    Quit date: 12/02/1997    Years since quitting: 21.7  . Smokeless tobacco: Never Used  . Tobacco comment: Stopped 20 years ago  Substance and Sexual Activity  . Alcohol use: Yes    Comment: Wine daily.   . Drug use: No  . Sexual activity: Yes  Lifestyle  . Physical activity    Days per week: Not on file    Minutes per session: Not on file  . Stress: Not on file  Relationships  .  Social Musicianconnections    Talks on phone: Not on file    Gets together: Not on file    Attends religious service: Not on file    Active member of club or organization: Not on file    Attends meetings of clubs or organizations: Not on file    Relationship status: Not on file  Other Topics Concern  . Not on file  Social History Narrative  . Not on file     Family History: The patient's family history includes Arthritis in her mother; Breast cancer in her maternal aunt and maternal grandmother; Cancer in her father, maternal grandfather, and paternal grandmother; Dementia in her paternal grandfather; Heart attack in her brother; Hypertension in her mother.  ROS:   Please see the  history of present illness.    No complaints.  Former smoker greater than 30 years ago.  All other systems reviewed and are negative.  EKGs/Labs/Other Studies Reviewed:    The following studies were reviewed today: No cardiac imaging data.  EKG:  EKG performed on 09/07/2019 reveals a normal tracing with normal sinus rhythm and no evidence of ischemia or infarction.  Recent Labs: 12/22/2018: Hemoglobin 12.0; Platelets 298.0; TSH 1.81 09/07/2019: ALT 11; BUN 9; Creatinine, Ser 0.70; Potassium 4.0; Sodium 137  Recent Lipid Panel    Component Value Date/Time   CHOL 213 (H) 09/07/2019 1208   TRIG 199.0 (H) 09/07/2019 1208   HDL 93.10 09/07/2019 1208   CHOLHDL 2 09/07/2019 1208   VLDL 39.8 09/07/2019 1208   LDLCALC 81 09/07/2019 1208   LDLDIRECT 79.0 12/22/2018 0912    Physical Exam:    VS:  BP 124/70   Pulse 83   Ht 5\' 9"  (1.753 m)   Wt 154 lb (69.9 kg)   SpO2 98%   BMI 22.74 kg/m     Wt Readings from Last 3 Encounters:  09/14/19 154 lb (69.9 kg)  09/07/19 150 lb 8 oz (68.3 kg)  12/22/18 147 lb 4 oz (66.8 kg)     GEN: Slender. No acute distress HEENT: Normal NECK: No JVD. LYMPHATICS: No lymphadenopathy CARDIAC:  RRR without murmur, gallop, or edema. VASCULAR:  Normal Pulses. No bruits. RESPIRATORY:  Clear to auscultation without rales, wheezing or rhonchi  ABDOMEN: Soft, non-tender, non-distended, No pulsatile mass, MUSCULOSKELETAL: No deformity  SKIN: Warm and dry NEUROLOGIC:  Alert and oriented x 3 PSYCHIATRIC:  Normal affect   ASSESSMENT:    1. Mixed hyperlipidemia   2. Family history of early CAD   3. Educated about COVID-19 virus infection    PLAN:    In order of problems listed above:  1. Agree with recent institution of low intensity statin therapy given family history of premature CAD.  Contemplated performing a coronary calcium score but I do not believe this is necessary.  Hopefully the LDL will be 70 or less with the addition of therapy. 2. Likely  a genetic component particularly on her father's side of the family.  She is a prior smoker.  She does not have diabetes.  She needs to increase physical activity as preventive measures. 3. The 3W's  are endorsed as lifestyle practices  Overall education and awareness concerning primary risk prevention was discussed in detail: LDL less than 70, hemoglobin A1c less than 7, blood pressure target less than 130/80 mmHg, >150 minutes of moderate aerobic activity per week, avoidance of smoking, weight control (via diet and exercise), and continued surveillance/management of/for obstructive sleep apnea.    Medication Adjustments/Labs and  Tests Ordered: Current medicines are reviewed at length with the patient today.  Concerns regarding medicines are outlined above.  No orders of the defined types were placed in this encounter.  No orders of the defined types were placed in this encounter.   Patient Instructions  Medication Instructions:  Your physician recommends that you continue on your current medications as directed. Please refer to the Current Medication list given to you today.  *If you need a refill on your cardiac medications before your next appointment, please call your pharmacy*  Lab Work: None If you have labs (blood work) drawn today and your tests are completely normal, you will receive your results only by: Marland Kitchen MyChart Message (if you have MyChart) OR . A paper copy in the mail If you have any lab test that is abnormal or we need to change your treatment, we will call you to review the results.  Testing/Procedures: None  Follow-Up: At [facility], you and your health needs are our priority.  As part of our continuing mission to provide you with exceptional heart care, we have created designated Provider Care Teams.  These Care Teams include your primary Cardiologist (physician) and Advanced Practice Providers (APPs -  Physician Assistants and Nurse Practitioners) who all work  together to provide you with the care you need, when you need it.  Your next appointment:   12 months  The format for your next appointment:   In Person  Provider:   You may see Dr. Verdis Black or one of the following Advanced Practice Providers on your designated Care Team:    Renee Fredrickson, NP  Renee Boozer, NP  Renee Chard, NP   Other Instructions  At minimum, you should be getting 150 minutes a week of moderate aerobic activity.      Signed, Lesleigh Noe, MD  09/14/2019 11:38 AM    [facility]

## 2019-09-14 ENCOUNTER — Ambulatory Visit (INDEPENDENT_AMBULATORY_CARE_PROVIDER_SITE_OTHER): Payer: 59 | Admitting: Interventional Cardiology

## 2019-09-14 ENCOUNTER — Encounter: Payer: Self-pay | Admitting: Interventional Cardiology

## 2019-09-14 ENCOUNTER — Other Ambulatory Visit: Payer: Self-pay

## 2019-09-14 VITALS — BP 124/70 | HR 83 | Ht 69.0 in | Wt 154.0 lb

## 2019-09-14 DIAGNOSIS — Z8249 Family history of ischemic heart disease and other diseases of the circulatory system: Secondary | ICD-10-CM

## 2019-09-14 DIAGNOSIS — Z7189 Other specified counseling: Secondary | ICD-10-CM | POA: Diagnosis not present

## 2019-09-14 DIAGNOSIS — E782 Mixed hyperlipidemia: Secondary | ICD-10-CM

## 2019-09-14 NOTE — Patient Instructions (Signed)
Medication Instructions:  Your physician recommends that you continue on your current medications as directed. Please refer to the Current Medication list given to you today.  *If you need a refill on your cardiac medications before your next appointment, please call your pharmacy*  Lab Work: None If you have labs (blood work) drawn today and your tests are completely normal, you will receive your results only by: Marland Kitchen MyChart Message (if you have MyChart) OR . A paper copy in the mail If you have any lab test that is abnormal or we need to change your treatment, we will call you to review the results.  Testing/Procedures: None  Follow-Up: At [facility], you and your health needs are our priority.  As part of our continuing mission to provide you with exceptional heart care, we have created designated Provider Care Teams.  These Care Teams include your primary Cardiologist (physician) and Advanced Practice Providers (APPs -  Physician Assistants and Nurse Practitioners) who all work together to provide you with the care you need, when you need it.  Your next appointment:   12 months  The format for your next appointment:   In Person  Provider:   You may see Dr. Daneen Schick or one of the following Advanced Practice Providers on your designated Care Team:    Truitt Merle, NP  Cecilie Kicks, NP  Kathyrn Drown, NP   Other Instructions  At minimum, you should be getting 150 minutes a week of moderate aerobic activity.

## 2019-12-27 ENCOUNTER — Other Ambulatory Visit: Payer: Self-pay

## 2019-12-27 ENCOUNTER — Encounter: Payer: Self-pay | Admitting: Family Medicine

## 2019-12-27 ENCOUNTER — Ambulatory Visit (INDEPENDENT_AMBULATORY_CARE_PROVIDER_SITE_OTHER): Payer: 59 | Admitting: Family Medicine

## 2019-12-27 VITALS — BP 122/82 | HR 99 | Temp 97.9°F | Resp 16 | Ht 69.0 in | Wt 153.4 lb

## 2019-12-27 DIAGNOSIS — E782 Mixed hyperlipidemia: Secondary | ICD-10-CM

## 2019-12-27 DIAGNOSIS — Z0000 Encounter for general adult medical examination without abnormal findings: Secondary | ICD-10-CM | POA: Diagnosis not present

## 2019-12-27 LAB — BASIC METABOLIC PANEL
BUN: 12 mg/dL (ref 6–23)
CO2: 28 mEq/L (ref 19–32)
Calcium: 10.1 mg/dL (ref 8.4–10.5)
Chloride: 101 mEq/L (ref 96–112)
Creatinine, Ser: 0.82 mg/dL (ref 0.40–1.20)
GFR: 72.45 mL/min (ref >60.00)
Glucose, Bld: 95 mg/dL (ref 70–99)
Potassium: 4.6 mEq/L (ref 3.5–5.1)
Sodium: 138 mEq/L (ref 135–145)

## 2019-12-27 LAB — LIPID PANEL
Cholesterol: 214 mg/dL — ABNORMAL HIGH (ref 0–200)
HDL: 113.4 mg/dL (ref >39.00)
LDL Cholesterol: 74 mg/dL (ref 0–99)
NonHDL: 100.47
Total CHOL/HDL Ratio: 2
Triglycerides: 131 mg/dL (ref 0.0–149.0)
VLDL: 26.2 mg/dL (ref 0.0–40.0)

## 2019-12-27 LAB — TSH: TSH: 3.2 u[IU]/mL (ref 0.35–4.50)

## 2019-12-27 LAB — CBC WITH DIFFERENTIAL/PLATELET
Basophils Absolute: 0 10*3/uL (ref 0.0–0.1)
Basophils Relative: 0.6 % (ref 0.0–3.0)
Eosinophils Absolute: 0.1 10*3/uL (ref 0.0–0.7)
Eosinophils Relative: 1.5 % (ref 0.0–5.0)
HCT: 40.1 % (ref 36.0–46.0)
Hemoglobin: 13.3 g/dL (ref 12.0–15.0)
Lymphocytes Relative: 28.1 % (ref 12.0–46.0)
Lymphs Abs: 1.8 10*3/uL (ref 0.7–4.0)
MCHC: 33.2 g/dL (ref 30.0–36.0)
MCV: 92.9 fl (ref 78.0–100.0)
Monocytes Absolute: 0.6 10*3/uL (ref 0.1–1.0)
Monocytes Relative: 8.5 % (ref 3.0–12.0)
Neutro Abs: 4 10*3/uL (ref 1.4–7.7)
Neutrophils Relative %: 61.3 % (ref 43.0–77.0)
Platelets: 303 10*3/uL (ref 150.0–400.0)
RBC: 4.32 Mil/uL (ref 3.87–5.11)
RDW: 14 % (ref 11.5–15.5)
WBC: 6.6 10*3/uL (ref 4.0–10.5)

## 2019-12-27 LAB — HEPATIC FUNCTION PANEL
ALT: 15 U/L (ref 0–35)
AST: 20 U/L (ref 0–37)
Albumin: 4.5 g/dL (ref 3.5–5.2)
Alkaline Phosphatase: 51 U/L (ref 39–117)
Bilirubin, Direct: 0.1 mg/dL (ref 0.0–0.3)
Total Bilirubin: 0.4 mg/dL (ref 0.2–1.2)
Total Protein: 7.1 g/dL (ref 6.0–8.3)

## 2019-12-27 NOTE — Assessment & Plan Note (Signed)
Pt's PE WNL.  UTD on GYN, immunizations, colonoscopy.  Check labs.  Anticipatory guidance provided.  

## 2019-12-27 NOTE — Patient Instructions (Signed)
Follow up in 6 months to recheck cholesterol We'll notify you of your lab results and make any changes if needed Continue to work on healthy diet and regular exercise- you look great!!! Call with any questions or concerns Stay Safe!  Stay Healthy!! 

## 2019-12-27 NOTE — Assessment & Plan Note (Signed)
Chronic problem.  Tolerating statin w/o difficulty.  Check labs.  Adjust meds prn  

## 2019-12-27 NOTE — Progress Notes (Signed)
   Subjective:    Patient ID: Renee Black, female    DOB: 15-May-1966, 54 y.o.   MRN: [id number]  HPI CPE- UTD on GYN, colonoscopy, immunizations.   Review of Systems Patient reports no vision/ hearing changes, adenopathy,fever, weight change,  persistant/recurrent hoarseness , swallowing issues, chest pain, palpitations, edema, persistant/recurrent cough, hemoptysis, dyspnea (rest/exertional/paroxysmal nocturnal), gastrointestinal bleeding (melena, rectal bleeding), abdominal pain, significant heartburn, bowel changes, GU symptoms (dysuria, hematuria, incontinence), Gyn symptoms (abnormal  bleeding, pain),  syncope, focal weakness, memory loss, numbness & tingling, skin/hair/nail changes, abnormal bruising or bleeding, anxiety, or depression.   This visit occurred during the SARS-CoV-2 public health emergency.  Safety protocols were in place, including screening questions prior to the visit, additional usage of staff PPE, and extensive cleaning of exam room while observing appropriate contact time as indicated for disinfecting solutions.       Objective:   Physical Exam General Appearance:    Alert, cooperative, no distress, appears stated age  Head:    Normocephalic, without obvious abnormality, atraumatic  Eyes:    PERRL, conjunctiva/corneas clear, EOM's intact, fundi    benign, both eyes  Ears:    Normal TM's and external ear canals, both ears  Nose:   Deferred due to COVID  Throat:   Neck:   Supple, symmetrical, trachea midline, no adenopathy;    Thyroid: no enlargement/tenderness/nodules  Back:     Symmetric, no curvature, ROM normal, no CVA tenderness  Lungs:     Clear to auscultation bilaterally, respirations unlabored  Chest Wall:    No tenderness or deformity   Heart:    Regular rate and rhythm, S1 and S2 normal, no murmur, rub   or gallop  Breast Exam:    Deferred to GYN  Abdomen:     Soft, non-tender, bowel sounds active all four quadrants,    no masses, no organomegaly    Genitalia:    Deferred to GYN  Rectal:    Extremities:   Extremities normal, atraumatic, no cyanosis or edema  Pulses:   2+ and symmetric all extremities  Skin:   Skin color, texture, turgor normal, no rashes or lesions  Lymph nodes:   Cervical, supraclavicular, and axillary nodes normal  Neurologic:   CNII-XII intact, normal strength, sensation and reflexes    throughout          Assessment & Plan:

## 2020-03-06 ENCOUNTER — Other Ambulatory Visit: Payer: Self-pay | Admitting: Family Medicine

## 2020-04-20 ENCOUNTER — Other Ambulatory Visit: Payer: Self-pay | Admitting: Family Medicine

## 2020-04-20 DIAGNOSIS — Z1231 Encounter for screening mammogram for malignant neoplasm of breast: Secondary | ICD-10-CM

## 2020-06-04 ENCOUNTER — Ambulatory Visit: Payer: 59

## 2020-06-08 ENCOUNTER — Ambulatory Visit
Admission: RE | Admit: 2020-06-08 | Discharge: 2020-06-08 | Disposition: A | Discharge status: Home / Self Care | Payer: No Typology Code available for payment source | Source: Ambulatory Visit | Attending: Family Medicine | Admitting: Family Medicine

## 2020-06-08 ENCOUNTER — Other Ambulatory Visit: Payer: Self-pay

## 2020-06-08 DIAGNOSIS — Z1231 Encounter for screening mammogram for malignant neoplasm of breast: Secondary | ICD-10-CM

## 2020-06-29 ENCOUNTER — Encounter: Payer: Self-pay | Admitting: Family Medicine

## 2020-06-29 ENCOUNTER — Other Ambulatory Visit: Payer: Self-pay

## 2020-06-29 ENCOUNTER — Ambulatory Visit: Payer: 59 | Admitting: Family Medicine

## 2020-06-29 ENCOUNTER — Ambulatory Visit (INDEPENDENT_AMBULATORY_CARE_PROVIDER_SITE_OTHER): Payer: No Typology Code available for payment source | Admitting: Family Medicine

## 2020-06-29 VITALS — BP 110/62 | HR 50 | Temp 97.6°F | Ht 69.0 in | Wt 146.0 lb

## 2020-06-29 DIAGNOSIS — K5903 Drug induced constipation: Secondary | ICD-10-CM

## 2020-06-29 DIAGNOSIS — E782 Mixed hyperlipidemia: Secondary | ICD-10-CM

## 2020-06-29 NOTE — Progress Notes (Signed)
   Subjective:    Patient ID: Renee Black, female    DOB: Dec 25, 1965, 54 y.o.   MRN: [id number]  HPI Hyperlipidemia- chronic problem, on Lipitor 10mg  daily.  Pt is down 7 lbs since last visit.  Pt reports she has changed diet and exercise w/ Noom.  Pt reports feeling good.  No CP, SOB, HAs, abd pain, N/V  Constipation- pt reports this is a side effect of the newly started Amitriptyline for nerve pain.  Required a laxative the other day.   Review of Systems For ROS see HPI   This visit occurred during the SARS-CoV-2 public health emergency.  Safety protocols were in place, including screening questions prior to the visit, additional usage of staff PPE, and extensive cleaning of exam room while observing appropriate contact time as indicated for disinfecting solutions.       Objective:   Physical Exam Vitals reviewed.  Constitutional:      General: She is not in acute distress.    Appearance: Normal appearance. She is well-developed.  HENT:     Head: Normocephalic and atraumatic.  Eyes:     Conjunctiva/sclera: Conjunctivae normal.     Pupils: Pupils are equal, round, and reactive to light.  Neck:     Thyroid: No thyromegaly.  Cardiovascular:     Rate and Rhythm: Normal rate and regular rhythm.     Heart sounds: Normal heart sounds. No murmur heard.   Pulmonary:     Effort: Pulmonary effort is normal. No respiratory distress.     Breath sounds: Normal breath sounds.  Abdominal:     General: There is no distension.     Palpations: Abdomen is soft.     Tenderness: There is no abdominal tenderness.  Musculoskeletal:     Cervical back: Normal range of motion and neck supple.  Lymphadenopathy:     Cervical: No cervical adenopathy.  Skin:    General: Skin is warm and dry.  Neurological:     Mental Status: She is alert and oriented to person, place, and time.  Psychiatric:        Behavior: Behavior normal.           Assessment & Plan:

## 2020-06-29 NOTE — Assessment & Plan Note (Signed)
Tolerating statin w/o difficulty.  Applauded her efforts at healthy diet and regular exercise.  Check labs.  Adjust meds prn

## 2020-06-29 NOTE — Assessment & Plan Note (Signed)
New since starting Amitriptyline.  Discussed addition of Miralax to improve symptoms and that she can use it daily or every other day as needed.  Pt expressed understanding and is in agreement w/ plan.

## 2020-06-29 NOTE — Patient Instructions (Addendum)
Schedule your complete physical in 6 months We'll notify you of your lab results and make any changes if needed Keep up the good work on healthy diet and regular exercise- you look great! Add Miralax daily or every other day to help w/ medication induced constipation Drink plenty of water Call with any questions or concerns Stay Safe!  Stay Healthy!

## 2020-06-30 LAB — BASIC METABOLIC PANEL
BUN: 13 mg/dL (ref 7–25)
CO2: 28 mmol/L (ref 20–32)
Calcium: 9.5 mg/dL (ref 8.6–10.4)
Chloride: 100 mmol/L (ref 98–110)
Creat: 0.78 mg/dL (ref 0.50–1.05)
Glucose, Bld: 127 mg/dL — ABNORMAL HIGH (ref 65–99)
Potassium: 4.2 mmol/L (ref 3.5–5.3)
Sodium: 137 mmol/L (ref 135–146)

## 2020-06-30 LAB — HEPATIC FUNCTION PANEL
AG Ratio: 1.7 (calc) (ref 1.0–2.5)
ALT: 19 U/L (ref 6–29)
AST: 24 U/L (ref 10–35)
Albumin: 4.5 g/dL (ref 3.6–5.1)
Alkaline phosphatase (APISO): 58 U/L (ref 37–153)
Bilirubin, Direct: 0.1 mg/dL (ref 0.0–0.2)
Globulin: 2.6 g/dL (calc) (ref 1.9–3.7)
Indirect Bilirubin: 0.3 mg/dL (calc) (ref 0.2–1.2)
Total Bilirubin: 0.4 mg/dL (ref 0.2–1.2)
Total Protein: 7.1 g/dL (ref 6.1–8.1)

## 2020-06-30 LAB — LIPID PANEL
Cholesterol: 162 mg/dL (ref <200)
HDL: 82 mg/dL (ref >=50)
LDL Cholesterol (Calc): 65 mg/dL (calc)
Non-HDL Cholesterol (Calc): 80 mg/dL (calc) (ref <130)
Total CHOL/HDL Ratio: 2 (calc) (ref <5.0)
Triglycerides: 71 mg/dL (ref <150)

## 2020-06-30 LAB — EXTRA LAV TOP TUBE

## 2020-07-02 ENCOUNTER — Encounter: Payer: Self-pay | Admitting: General Practice

## 2020-09-07 ENCOUNTER — Other Ambulatory Visit: Payer: Self-pay | Admitting: Family Medicine

## 2020-11-23 DIAGNOSIS — N841 Polyp of cervix uteri: Secondary | ICD-10-CM | POA: Insufficient documentation

## 2020-11-23 DIAGNOSIS — N94819 Vulvodynia, unspecified: Secondary | ICD-10-CM | POA: Insufficient documentation

## 2020-11-23 DIAGNOSIS — R7989 Other specified abnormal findings of blood chemistry: Secondary | ICD-10-CM | POA: Insufficient documentation

## 2020-12-25 ENCOUNTER — Other Ambulatory Visit: Payer: Self-pay | Admitting: Family Medicine

## 2020-12-28 ENCOUNTER — Encounter: Payer: Self-pay | Admitting: Family Medicine

## 2020-12-28 ENCOUNTER — Ambulatory Visit (INDEPENDENT_AMBULATORY_CARE_PROVIDER_SITE_OTHER): Payer: No Typology Code available for payment source | Admitting: Family Medicine

## 2020-12-28 ENCOUNTER — Other Ambulatory Visit: Payer: Self-pay

## 2020-12-28 VITALS — BP 110/80 | HR 68 | Temp 97.6°F | Resp 20 | Ht 67.5 in | Wt 138.2 lb

## 2020-12-28 DIAGNOSIS — Z01818 Encounter for other preprocedural examination: Secondary | ICD-10-CM | POA: Diagnosis not present

## 2020-12-28 DIAGNOSIS — E782 Mixed hyperlipidemia: Secondary | ICD-10-CM | POA: Diagnosis not present

## 2020-12-28 DIAGNOSIS — Z0000 Encounter for general adult medical examination without abnormal findings: Secondary | ICD-10-CM | POA: Diagnosis not present

## 2020-12-28 LAB — TSH: TSH: 1.56 u[IU]/mL (ref 0.35–4.50)

## 2020-12-28 LAB — LIPID PANEL
Cholesterol: 198 mg/dL (ref 0–200)
HDL: 103 mg/dL (ref >39.00)
LDL Cholesterol: 72 mg/dL (ref 0–99)
NonHDL: 95.07
Total CHOL/HDL Ratio: 2
Triglycerides: 116 mg/dL (ref 0.0–149.0)
VLDL: 23.2 mg/dL (ref 0.0–40.0)

## 2020-12-28 LAB — CBC WITH DIFFERENTIAL/PLATELET
Basophils Absolute: 0 10*3/uL (ref 0.0–0.1)
Basophils Relative: 0.4 % (ref 0.0–3.0)
Eosinophils Absolute: 0 10*3/uL (ref 0.0–0.7)
Eosinophils Relative: 0.3 % (ref 0.0–5.0)
HCT: 39.3 % (ref 36.0–46.0)
Hemoglobin: 13.4 g/dL (ref 12.0–15.0)
Lymphocytes Relative: 24.7 % (ref 12.0–46.0)
Lymphs Abs: 1.5 10*3/uL (ref 0.7–4.0)
MCHC: 34.2 g/dL (ref 30.0–36.0)
MCV: 92.7 fl (ref 78.0–100.0)
Monocytes Absolute: 0.4 10*3/uL (ref 0.1–1.0)
Monocytes Relative: 7 % (ref 3.0–12.0)
Neutro Abs: 4.2 10*3/uL (ref 1.4–7.7)
Neutrophils Relative %: 67.6 % (ref 43.0–77.0)
Platelets: 279 10*3/uL (ref 150.0–400.0)
RBC: 4.24 Mil/uL (ref 3.87–5.11)
RDW: 14.6 % (ref 11.5–15.5)
WBC: 6.2 10*3/uL (ref 4.0–10.5)

## 2020-12-28 LAB — BASIC METABOLIC PANEL
BUN: 9 mg/dL (ref 6–23)
CO2: 27 mEq/L (ref 19–32)
Calcium: 9.5 mg/dL (ref 8.4–10.5)
Chloride: 103 mEq/L (ref 96–112)
Creatinine, Ser: 0.73 mg/dL (ref 0.40–1.20)
GFR: 92.38 mL/min (ref >60.00)
Glucose, Bld: 96 mg/dL (ref 70–99)
Potassium: 3.7 mEq/L (ref 3.5–5.1)
Sodium: 137 mEq/L (ref 135–145)

## 2020-12-28 LAB — HEPATIC FUNCTION PANEL
ALT: 15 U/L (ref 0–35)
AST: 21 U/L (ref 0–37)
Albumin: 4.3 g/dL (ref 3.5–5.2)
Alkaline Phosphatase: 51 U/L (ref 39–117)
Bilirubin, Direct: 0.1 mg/dL (ref 0.0–0.3)
Total Bilirubin: 0.4 mg/dL (ref 0.2–1.2)
Total Protein: 7.1 g/dL (ref 6.0–8.3)

## 2020-12-28 NOTE — Patient Instructions (Signed)
Follow up in 1 year or as needed We'll notify you of your lab results and make any changes if needed Keep up the good work!  You look great! Call with any questions or concerns Good Luck With Surgery!!!

## 2020-12-28 NOTE — Assessment & Plan Note (Signed)
Chronic problem.  Tolerating Lipitor 10mg  w/ difficulty.  Check labs.  Adjust meds prn

## 2020-12-28 NOTE — Assessment & Plan Note (Signed)
Pt's PE WNL.  UTD on pap, mammo, colonoscopy, immunizations.  EKG done today WNL and she is cleared for upcoming breast surgery.  Check labs.  Anticipatory guidance provided.

## 2020-12-28 NOTE — Progress Notes (Signed)
   Subjective:    Patient ID: Renee Black, female    DOB: 1966-06-06, 55 y.o.   MRN: [id number]  HPI CPE- UTD on mammo, pap, colonoscopy, Tdap, flu shot, COVID.  Pt needs surgical clearance for upcoming breast re-augmentation and breast lift  Reviewed past medical, surgical, family and social histories.   Patient Care Team    Relationship Specialty Notifications Start End  Sheliah Hatch, MD PCP - General Family Medicine  12/02/17   Vida Rigger, MD Consulting Physician Gastroenterology  12/02/17   Maxie Better, MD Consulting Physician Obstetrics and Gynecology  12/27/19     Health Maintenance  Topic Date Due  . COVID-19 Vaccine (3 - Booster for Pfizer series) 08/15/2020  . PAP SMEAR-Modifier  03/18/2021 (Originally 06/25/2020)  . Hepatitis C Screening  12/28/2021 (Originally August 29, 1966)  . HIV Screening  12/28/2021 (Originally 04/04/1980)  . MAMMOGRAM  06/08/2022  . COLONOSCOPY (Pts 45-5yrs Insurance coverage will need to be confirmed)  10/30/2026  . TETANUS/TDAP  12/22/2028  . INFLUENZA VACCINE  Completed      Review of Systems Patient reports no vision/ hearing changes, adenopathy,fever, weight change,  persistant/recurrent hoarseness , swallowing issues, chest pain, palpitations, edema, persistant/recurrent cough, hemoptysis, dyspnea (rest/exertional/paroxysmal nocturnal), gastrointestinal bleeding (melena, rectal bleeding), abdominal pain, significant heartburn, bowel changes, GU symptoms (dysuria, hematuria, incontinence), Gyn symptoms (abnormal  bleeding, pain),  syncope, focal weakness, memory loss, numbness & tingling, skin/hair/nail changes, abnormal bruising or bleeding, anxiety, or depression.   This visit occurred during the SARS-CoV-2 public health emergency.  Safety protocols were in place, including screening questions prior to the visit, additional usage of staff PPE, and extensive cleaning of exam room while observing appropriate contact time as indicated  for disinfecting solutions.       Objective:   Physical Exam General Appearance:    Alert, cooperative, no distress, appears stated age  Head:    Normocephalic, without obvious abnormality, atraumatic  Eyes:    PERRL, conjunctiva/corneas clear, EOM's intact, fundi    benign, both eyes  Ears:    Normal TM's and external ear canals, both ears  Nose:   Deferred due to COVID  Throat:   Neck:   Supple, symmetrical, trachea midline, no adenopathy;    Thyroid: no enlargement/tenderness/nodules  Back:     Symmetric, no curvature, ROM normal, no CVA tenderness  Lungs:     Clear to auscultation bilaterally, respirations unlabored  Chest Wall:    No tenderness or deformity   Heart:    Regular rate and rhythm, S1 and S2 normal, no murmur, rub   or gallop  Breast Exam:    Deferred to mammo  Abdomen:     Soft, non-tender, bowel sounds active all four quadrants,    no masses, no organomegaly  Genitalia:    Deferred to GYN  Rectal:    Extremities:   Extremities normal, atraumatic, no cyanosis or edema  Pulses:   2+ and symmetric all extremities  Skin:   Skin color, texture, turgor normal, no rashes or lesions  Lymph nodes:   Cervical, supraclavicular, and axillary nodes normal  Neurologic:   CNII-XII intact, normal strength, sensation and reflexes    throughout          Assessment & Plan:

## 2021-02-06 ENCOUNTER — Encounter: Payer: Self-pay | Admitting: Family Medicine

## 2021-02-15 NOTE — Telephone Encounter (Signed)
Patient is calling back about a message from [employer].

## 2021-02-21 NOTE — Telephone Encounter (Signed)
Patient would like to come and pick up the forms from the office if they are still available.

## 2021-05-08 ENCOUNTER — Encounter: Payer: Self-pay | Admitting: *Deleted

## 2021-07-29 ENCOUNTER — Other Ambulatory Visit: Payer: Self-pay | Admitting: Family Medicine

## 2021-07-29 DIAGNOSIS — Z1231 Encounter for screening mammogram for malignant neoplasm of breast: Secondary | ICD-10-CM

## 2021-08-29 ENCOUNTER — Ambulatory Visit: Payer: No Typology Code available for payment source

## 2021-09-10 ENCOUNTER — Other Ambulatory Visit: Payer: Self-pay | Admitting: Family Medicine

## 2021-09-30 ENCOUNTER — Ambulatory Visit: Payer: No Typology Code available for payment source

## 2021-10-08 ENCOUNTER — Encounter: Payer: Self-pay | Admitting: Family Medicine

## 2021-10-08 ENCOUNTER — Ambulatory Visit (INDEPENDENT_AMBULATORY_CARE_PROVIDER_SITE_OTHER): Payer: No Typology Code available for payment source | Admitting: Family Medicine

## 2021-10-08 VITALS — BP 122/68 | HR 97 | Temp 98.3°F | Resp 16 | Ht 67.5 in | Wt 144.0 lb

## 2021-10-08 DIAGNOSIS — J101 Influenza due to other identified influenza virus with other respiratory manifestations: Secondary | ICD-10-CM

## 2021-10-08 DIAGNOSIS — Z780 Asymptomatic menopausal state: Secondary | ICD-10-CM | POA: Insufficient documentation

## 2021-10-08 DIAGNOSIS — R6889 Other general symptoms and signs: Secondary | ICD-10-CM | POA: Diagnosis not present

## 2021-10-08 DIAGNOSIS — N898 Other specified noninflammatory disorders of vagina: Secondary | ICD-10-CM | POA: Insufficient documentation

## 2021-10-08 DIAGNOSIS — R6882 Decreased libido: Secondary | ICD-10-CM | POA: Insufficient documentation

## 2021-10-08 DIAGNOSIS — N941 Unspecified dyspareunia: Secondary | ICD-10-CM | POA: Insufficient documentation

## 2021-10-08 DIAGNOSIS — N761 Subacute and chronic vaginitis: Secondary | ICD-10-CM | POA: Insufficient documentation

## 2021-10-08 DIAGNOSIS — E038 Other specified hypothyroidism: Secondary | ICD-10-CM | POA: Insufficient documentation

## 2021-10-08 LAB — POCT INFLUENZA A/B
Influenza A, POC: POSITIVE — AB
Influenza B, POC: NEGATIVE

## 2021-10-08 MED ORDER — PROMETHAZINE-DM 6.25-15 MG/5ML PO SYRP: 5.0000 mL | ORAL_SOLUTION | Freq: Four times a day (QID) | ORAL | 0 refills | Status: DC | PRN

## 2021-10-08 NOTE — Progress Notes (Signed)
   Subjective:    Patient ID: Renee Black, female    DOB: 1966/04/12, 55 y.o.   MRN: [id number]  HPI URI- woke Saturday w/ chest congestion.  By Saturday evening needed to go straight to bed.  + fever, chills, body aches.  + dry, 'hot burning' cough.  + intermittent nasal congestion.  No fever today- last fever was yesterday AM.     Review of Systems For ROS see HPI   This visit occurred during the SARS-CoV-2 public health emergency.  Safety protocols were in place, including screening questions prior to the visit, additional usage of staff PPE, and extensive cleaning of exam room while observing appropriate contact time as indicated for disinfecting solutions.      Objective:   Physical Exam Vitals reviewed.  Constitutional:      General: She is not in acute distress.    Appearance: She is well-developed. She is not ill-appearing.  HENT:     Head: Normocephalic and atraumatic.     Right Ear: Tympanic membrane normal.     Left Ear: Tympanic membrane normal.     Nose: Mucosal edema and rhinorrhea present.     Right Sinus: No maxillary sinus tenderness or frontal sinus tenderness.     Left Sinus: No maxillary sinus tenderness or frontal sinus tenderness.     Mouth/Throat:     Pharynx: Uvula midline. Posterior oropharyngeal erythema present. No oropharyngeal exudate.  Eyes:     Conjunctiva/sclera: Conjunctivae normal.     Pupils: Pupils are equal, round, and reactive to light.  Cardiovascular:     Rate and Rhythm: Normal rate and regular rhythm.     Heart sounds: Normal heart sounds.  Pulmonary:     Effort: Pulmonary effort is normal. No respiratory distress.     Breath sounds: Normal breath sounds. No wheezing.  Musculoskeletal:     Cervical back: Normal range of motion and neck supple.  Lymphadenopathy:     Cervical: No cervical adenopathy.  Skin:    General: Skin is warm.          Assessment & Plan:   Influenza A- pt's sxs are consistent w/ and rapid testing  confirms dx.  She is outside of the Tamiflu window so will treat supportively w/ cough syrup, tylenol/ibuprofen, fluids, and rest.  Reviewed red flags that should prompt return.  Pt expressed understanding and is in agreement w/ plan.

## 2021-10-08 NOTE — Patient Instructions (Signed)
Follow up as needed or as scheduled You have Flu A Drink plenty of fluids REST!! Tylenol or Ibuprofen as needed for fever or body aches Prescription cough syrup for nights or when you are able to be drowsy Delsym or Mucinex DM for daytime cough Call with any questions or concerns Hang in there! Happy Holidays!

## 2021-10-09 ENCOUNTER — Encounter: Payer: Self-pay | Admitting: Family Medicine

## 2021-10-09 MED ORDER — BENZONATATE 200 MG PO CAPS: 200.0000 mg | ORAL_CAPSULE | Freq: Two times a day (BID) | ORAL | 0 refills | Status: DC | PRN

## 2021-10-10 ENCOUNTER — Telehealth: Payer: Self-pay

## 2021-10-10 ENCOUNTER — Other Ambulatory Visit: Payer: Self-pay | Admitting: Family Medicine

## 2021-10-10 MED ORDER — ALBUTEROL SULFATE HFA 108 (90 BASE) MCG/ACT IN AERS: 2.0000 | INHALATION_SPRAY | Freq: Four times a day (QID) | RESPIRATORY_TRACT | 0 refills | Status: DC | PRN

## 2021-10-10 NOTE — Telephone Encounter (Signed)
Caller name:Renee Black   On DPR? :yes  Call back number:[phone]  Provider they see: Beverely Low  Reason for call:Pt is calling she is not improving and is showing some wheezing and wants to know what she needs to do I have made a follow up appt with Gerlene Burdock tomorrow.

## 2021-10-10 NOTE — Telephone Encounter (Signed)
Addressed via MyChart

## 2021-10-11 ENCOUNTER — Ambulatory Visit (INDEPENDENT_AMBULATORY_CARE_PROVIDER_SITE_OTHER): Payer: No Typology Code available for payment source | Admitting: Registered Nurse

## 2021-10-11 ENCOUNTER — Encounter: Payer: Self-pay | Admitting: Registered Nurse

## 2021-10-11 VITALS — BP 131/84 | HR 79 | Temp 98.3°F | Resp 18 | Ht 67.5 in | Wt 144.8 lb

## 2021-10-11 DIAGNOSIS — J22 Unspecified acute lower respiratory infection: Secondary | ICD-10-CM

## 2021-10-11 DIAGNOSIS — B3731 Acute candidiasis of vulva and vagina: Secondary | ICD-10-CM | POA: Diagnosis not present

## 2021-10-11 MED ORDER — FLUCONAZOLE 150 MG PO TABS: 150.0000 mg | ORAL_TABLET | Freq: Once | ORAL | 0 refills | Status: AC

## 2021-10-11 MED ORDER — AZITHROMYCIN 250 MG PO TABS: ORAL_TABLET | ORAL | 0 refills | Status: AC

## 2021-10-11 MED ORDER — PREDNISONE 10 MG (21) PO TBPK: ORAL_TABLET | ORAL | 0 refills | Status: DC

## 2021-10-11 NOTE — Addendum Note (Signed)
Addended by: Janeece Agee on: 10/11/2021 12:21 PM   Modules accepted: Orders

## 2021-10-11 NOTE — Progress Notes (Addendum)
Acute Office Visit  Subjective:    Patient ID: Renee Black, female    DOB: 04-27-1966, 55 y.o.   MRN: [id number]  Chief Complaint  Patient presents with   Follow-up    Patient states she was here on Tuesday and tested positive for the flu but symptoms are now getting worse. Pt states the cough , and wheezing of the chest is bothering her    HPI Patient is in today for follow up   Here for visit with PCP Dr. Beverely Low on 10/08/21 Tested positive for flu A after a few days of symptoms  Conservative management with antitussives and OTC analgesics  Unfortunately worsening. Cough and wheezing in chest most problematic. Cough seems to be worsening - dry, hacking cough. No mucus Wheezing  Body aches  No longer febrile  Notes rx cough syrup with codeine too sedative  She is doing better with inhaler and tessalon that were sent in yesterday.   History reviewed. No pertinent past medical history.  Past Surgical History:  Procedure Laterality Date   AUGMENTATION MAMMAPLASTY Bilateral 2005   ROTATOR CUFF REPAIR      Family History  Problem Relation Age of Onset   Breast cancer Maternal Aunt    Breast cancer Maternal Grandmother    Arthritis Mother    Hypertension Mother    Cancer Father        melanoma   Cancer Maternal Grandfather        lung   Cancer Paternal Grandmother        colon   Dementia Paternal Grandfather    Heart attack Brother     Social History   Socioeconomic History   Marital status: Married    Spouse name: Not on file   Number of children: Not on file   Years of education: Not on file   Highest education level: Not on file  Occupational History   Not on file  Tobacco Use   Smoking status: Former    Types: Cigarettes    Quit date: 12/02/1997    Years since quitting: 23.8   Smokeless tobacco: Never   Tobacco comments:    Stopped 20 years ago  Vaping Use   Vaping Use: Never used  Substance and Sexual Activity   Alcohol use: Yes     Comment: Wine daily.    Drug use: No   Sexual activity: Yes  Other Topics Concern   Not on file  Social History Narrative   Not on file   Social Determinants of Health   Financial Resource Strain: Not on file  Food Insecurity: Not on file  Transportation Needs: Not on file  Physical Activity: Not on file  Stress: Not on file  Social Connections: Not on file  Intimate Partner Violence: Not on file    Outpatient Medications Prior to Visit  Medication Sig Dispense Refill   albuterol (VENTOLIN HFA) 108 (90 Base) MCG/ACT inhaler Inhale 2 puffs into the lungs every 6 (six) hours as needed for wheezing or shortness of breath. 8 g 0   atorvastatin (LIPITOR) 10 MG tablet TAKE 1 TABLET BY MOUTH EVERY DAY 90 tablet 1   benzonatate (TESSALON) 200 MG capsule Take 1 capsule (200 mg total) by mouth 2 (two) times daily as needed for cough. 60 capsule 0   fluticasone (FLONASE) 50 MCG/ACT nasal spray Place 2 sprays into both nostrils daily.     ibuprofen (ADVIL,MOTRIN) 800 MG tablet ibuprofen 800 mg tablet     progesterone (  PROMETRIUM) 100 MG capsule Take 100 mg by mouth at bedtime.     progesterone (PROMETRIUM) 100 MG capsule Take 100 mg by mouth at bedtime.     promethazine-dextromethorphan (PROMETHAZINE-DM) 6.25-15 MG/5ML syrup Take 5 mLs by mouth 4 (four) times daily as needed. 180 mL 0   valACYclovir (VALTREX) 1000 MG tablet TAKE 2 TABLETS BY MOUTH EVERY TWELVE HOURS FOR 1 DAY AT ONSET OF FEVER blister symptoms     No facility-administered medications prior to visit.    Allergies  Allergen Reactions   Augmentin [Amoxicillin-Pot Clavulanate] Diarrhea and Nausea And Vomiting    Pt had to be treated at ED   Codeine Itching   Hydrocodone Other (See Comments)   Hydrocortisone Itching   Sulfa Antibiotics Itching    Review of Systems  Constitutional: Negative.   HENT: Negative.    Eyes: Negative.   Respiratory:  Positive for wheezing. Negative for apnea, cough, choking, chest tightness,  shortness of breath and stridor.   Cardiovascular: Negative.   Gastrointestinal: Negative.   Genitourinary: Negative.   Musculoskeletal: Negative.   Skin: Negative.   Neurological: Negative.   Psychiatric/Behavioral: Negative.    All other systems reviewed and are negative.     Objective:    Physical Exam Vitals and nursing note reviewed.  Constitutional:      General: She is not in acute distress.    Appearance: Normal appearance. She is not ill-appearing, toxic-appearing or diaphoretic.  Cardiovascular:     Rate and Rhythm: Normal rate and regular rhythm.     Pulses: Normal pulses.     Heart sounds: Normal heart sounds. No murmur heard.   No friction rub. No gallop.  Pulmonary:     Effort: Pulmonary effort is normal. No respiratory distress.     Breath sounds: No stridor. Wheezing (throughout) present. No rhonchi or rales.  Chest:     Chest wall: No tenderness.  Skin:    General: Skin is warm and dry.     Capillary Refill: Capillary refill takes less than 2 seconds.  Neurological:     General: No focal deficit present.     Mental Status: She is alert and oriented to person, place, and time. Mental status is at baseline.  Psychiatric:        Mood and Affect: Mood normal.        Behavior: Behavior normal.        Thought Content: Thought content normal.        Judgment: Judgment normal.    BP 131/84   Pulse 79   Temp 98.3 F (36.8 C) (Temporal)   Resp 18   Ht 5' 7.5" (1.715 m)   Wt 144 lb 12.8 oz (65.7 kg)   SpO2 100%   BMI 22.34 kg/m  Wt Readings from Last 3 Encounters:  10/11/21 144 lb 12.8 oz (65.7 kg)  10/08/21 144 lb (65.3 kg)  12/28/20 138 lb 3.2 oz (62.7 kg)    Health Maintenance Due  Topic Date Due   Zoster Vaccines- Shingrix (1 of 2) Never done   COVID-19 Vaccine (3 - Booster for Pfizer series) 04/10/2020   PAP SMEAR-Modifier  06/25/2020   INFLUENZA VACCINE  06/10/2021    There are no preventive care reminders to display for this  patient.   Lab Results  Component Value Date   TSH 1.56 12/28/2020   Lab Results  Component Value Date   WBC 6.2 12/28/2020   HGB 13.4 12/28/2020   HCT 39.3 12/28/2020  MCV 92.7 12/28/2020   PLT 279.0 12/28/2020   Lab Results  Component Value Date   NA 137 12/28/2020   K 3.7 12/28/2020   CO2 27 12/28/2020   GLUCOSE 96 12/28/2020   BUN 9 12/28/2020   CREATININE 0.73 12/28/2020   BILITOT 0.4 12/28/2020   ALKPHOS 51 12/28/2020   AST 21 12/28/2020   ALT 15 12/28/2020   PROT 7.1 12/28/2020   ALBUMIN 4.3 12/28/2020   CALCIUM 9.5 12/28/2020   GFR 92.38 12/28/2020   Lab Results  Component Value Date   CHOL 198 12/28/2020   Lab Results  Component Value Date   HDL 103.00 12/28/2020   Lab Results  Component Value Date   LDLCALC 72 12/28/2020   Lab Results  Component Value Date   TRIG 116.0 12/28/2020   Lab Results  Component Value Date   CHOLHDL 2 12/28/2020   No results found for: HGBA1C     Assessment & Plan:   Problem List Items Addressed This Visit   None Visit Diagnoses     Lower respiratory infection    -  Primary   Relevant Medications   azithromycin (ZITHROMAX) 250 MG tablet   predniSONE (STERAPRED UNI-PAK 21 TAB) 10 MG (21) TBPK tablet   fluconazole (DIFLUCAN) 150 MG tablet   Vaginal candida       Relevant Medications   azithromycin (ZITHROMAX) 250 MG tablet   fluconazole (DIFLUCAN) 150 MG tablet        Meds ordered this encounter  Medications   azithromycin (ZITHROMAX) 250 MG tablet    Sig: Take 2 tablets on day 1, then 1 tablet daily on days 2 through 5    Dispense:  6 tablet    Refill:  0    Order Specific Question:   Supervising Provider    Answer:   Neva Seat, JEFFREY R [[id number]]   predniSONE (STERAPRED UNI-PAK 21 TAB) 10 MG (21) TBPK tablet    Sig: Take per package instructions. Do not skip doses. Finish entire supply.    Dispense:  1 each    Refill:  0    Order Specific Question:   Supervising Provider    Answer:   Neva Seat,  JEFFREY R [[id number]]   fluconazole (DIFLUCAN) 150 MG tablet    Sig: Take 1 tablet (150 mg total) by mouth once for 1 dose.    Dispense:  1 tablet    Refill:  0    Order Specific Question:   Supervising Provider    Answer:   Neva Seat, JEFFREY R [[id number]]   PLAN With worsening cough and wheezing will give z pack and prednisone Continue tessalon, mucinex, and albuterol Deep breathing exercises and rest recommended Return if worsening or failing to improve. ER precautions reviewed with pt who voices understanding Pt reports vaginal candida after abx use. Will send diflucan as above. Patient encouraged to call clinic with any questions, comments, or concerns.   Janeece Agee, NP

## 2021-10-11 NOTE — Patient Instructions (Addendum)
Ms. Renee Black to meet you  Take prednisone and azithromycin - finish entire course even if feeling better  Ok to continue tessalon, mucinex, and albuterol  Rest up, hydrate, and do some deep breathing exercises  Call if worsening or failing to improve  Thank you  Rich     If you have lab work done today you will be contacted with your lab results within the next 2 weeks.  If you have not heard from [place] then please contact us. The fastest way to get your results is to register for My Chart.   IF you received an x-ray today, you will receive an invoice from [facility]. Please contact [facility] at [phone] with questions or concerns regarding your invoice.   IF you received labwork today, you will receive an invoice from [facility]. Please contact [facility] at [phone] with questions or concerns regarding your invoice.   Our billing staff will not be able to assist you with questions regarding bills from these companies.  You will be contacted with the lab results as soon as they are available. The fastest way to get your results is to activate your My Chart account. Instructions are located on the last page of this paperwork. If you have not heard from [place] regarding the results in 2 weeks, please contact this office.

## 2021-10-30 ENCOUNTER — Ambulatory Visit
Admission: RE | Admit: 2021-10-30 | Discharge: 2021-10-30 | Disposition: A | Discharge status: Home / Self Care | Payer: No Typology Code available for payment source | Source: Ambulatory Visit | Attending: Family Medicine | Admitting: Family Medicine

## 2021-10-30 DIAGNOSIS — Z1231 Encounter for screening mammogram for malignant neoplasm of breast: Secondary | ICD-10-CM

## 2022-01-03 ENCOUNTER — Encounter: Payer: Self-pay | Admitting: Family Medicine

## 2022-01-03 ENCOUNTER — Ambulatory Visit (INDEPENDENT_AMBULATORY_CARE_PROVIDER_SITE_OTHER): Payer: No Typology Code available for payment source | Admitting: Family Medicine

## 2022-01-03 VITALS — BP 114/72 | HR 79 | Temp 97.7°F | Resp 16 | Ht 67.5 in | Wt 145.8 lb

## 2022-01-03 DIAGNOSIS — Z114 Encounter for screening for human immunodeficiency virus [HIV]: Secondary | ICD-10-CM | POA: Diagnosis not present

## 2022-01-03 DIAGNOSIS — Z1159 Encounter for screening for other viral diseases: Secondary | ICD-10-CM

## 2022-01-03 DIAGNOSIS — Z0000 Encounter for general adult medical examination without abnormal findings: Secondary | ICD-10-CM | POA: Diagnosis not present

## 2022-01-03 DIAGNOSIS — E782 Mixed hyperlipidemia: Secondary | ICD-10-CM

## 2022-01-03 LAB — HEPATIC FUNCTION PANEL
ALT: 16 U/L (ref 0–35)
AST: 20 U/L (ref 0–37)
Albumin: 4.4 g/dL (ref 3.5–5.2)
Alkaline Phosphatase: 51 U/L (ref 39–117)
Bilirubin, Direct: 0.1 mg/dL (ref 0.0–0.3)
Total Bilirubin: 0.6 mg/dL (ref 0.2–1.2)
Total Protein: 7 g/dL (ref 6.0–8.3)

## 2022-01-03 LAB — CBC WITH DIFFERENTIAL/PLATELET
Basophils Absolute: 0 10*3/uL (ref 0.0–0.1)
Basophils Relative: 0.5 % (ref 0.0–3.0)
Eosinophils Absolute: 0 10*3/uL (ref 0.0–0.7)
Eosinophils Relative: 0.7 % (ref 0.0–5.0)
HCT: 39.4 % (ref 36.0–46.0)
Hemoglobin: 12.9 g/dL (ref 12.0–15.0)
Lymphocytes Relative: 21.2 % (ref 12.0–46.0)
Lymphs Abs: 1.5 10*3/uL (ref 0.7–4.0)
MCHC: 32.9 g/dL (ref 30.0–36.0)
MCV: 92.9 fl (ref 78.0–100.0)
Monocytes Absolute: 0.4 10*3/uL (ref 0.1–1.0)
Monocytes Relative: 5.8 % (ref 3.0–12.0)
Neutro Abs: 5 10*3/uL (ref 1.4–7.7)
Neutrophils Relative %: 71.8 % (ref 43.0–77.0)
Platelets: 304 10*3/uL (ref 150.0–400.0)
RBC: 4.24 Mil/uL (ref 3.87–5.11)
RDW: 14 % (ref 11.5–15.5)
WBC: 6.9 10*3/uL (ref 4.0–10.5)

## 2022-01-03 LAB — LIPID PANEL
Cholesterol: 189 mg/dL (ref 0–200)
HDL: 95.1 mg/dL (ref >39.00)
LDL Cholesterol: 63 mg/dL (ref 0–99)
NonHDL: 94.12
Total CHOL/HDL Ratio: 2
Triglycerides: 154 mg/dL — ABNORMAL HIGH (ref 0.0–149.0)
VLDL: 30.8 mg/dL (ref 0.0–40.0)

## 2022-01-03 LAB — TSH: TSH: 1.38 u[IU]/mL (ref 0.35–5.50)

## 2022-01-03 LAB — BASIC METABOLIC PANEL
BUN: 11 mg/dL (ref 6–23)
CO2: 30 mEq/L (ref 19–32)
Calcium: 9.3 mg/dL (ref 8.4–10.5)
Chloride: 102 mEq/L (ref 96–112)
Creatinine, Ser: 0.7 mg/dL (ref 0.40–1.20)
GFR: 96.46 mL/min (ref >60.00)
Glucose, Bld: 86 mg/dL (ref 70–99)
Potassium: 3.7 mEq/L (ref 3.5–5.1)
Sodium: 137 mEq/L (ref 135–145)

## 2022-01-03 NOTE — Patient Instructions (Addendum)
Follow up in 1 year or as needed We'll notify you of your lab results and make any changes if needed Keep up the good work on healthy diet and regular exercise- you look great!!! Call with any questions or concerns Stay Safe!  Stay Healthy! 

## 2022-01-03 NOTE — Assessment & Plan Note (Signed)
Pt's PE WNL.  UTD on pap, mammo, colonoscopy, immunizations.  Check labs.  Anticipatory guidance provided.  

## 2022-01-03 NOTE — Progress Notes (Signed)
Subjective:    Patient ID: Renee Black, female    DOB: 03/24/1966, 56 y.o.   MRN: [id number]  HPI CPE- pt thinks she's UTD on pap, UTD on mammo, colonoscopy, Tdap, flu.  No concerns today.  Patient Care Team    Relationship Specialty Notifications Start End  Midge Minium, MD PCP - General Family Medicine  12/02/17   Clarene Essex, MD Consulting Physician Gastroenterology  12/02/17   Servando Salina, MD Consulting Physician Obstetrics and Gynecology  12/27/19     Health Maintenance  Topic Date Due   HIV Screening  Never done   Hepatitis C Screening  Never done   Zoster Vaccines- Shingrix (1 of 2) Never done   COVID-19 Vaccine (3 - Booster for Pfizer series) 04/10/2020   PAP SMEAR-Modifier  06/25/2020   MAMMOGRAM  10/31/2023   COLONOSCOPY (Pts 45-58yrs Insurance coverage will need to be confirmed)  10/30/2026   TETANUS/TDAP  12/22/2028   INFLUENZA VACCINE  Completed   HPV VACCINES  Aged Out      Review of Systems Patient reports no vision/ hearing changes, adenopathy,fever, weight change,  persistant/recurrent hoarseness , swallowing issues, chest pain, palpitations, edema, persistant/recurrent cough, hemoptysis, dyspnea (rest/exertional/paroxysmal nocturnal), gastrointestinal bleeding (melena, rectal bleeding), abdominal pain, significant heartburn, bowel changes, GU symptoms (dysuria, hematuria, incontinence), Gyn symptoms (abnormal  bleeding, pain),  syncope, focal weakness, memory loss, numbness & tingling, skin/nail changes, abnormal bruising or bleeding, anxiety, or depression.   + neck pain- pt has hx of neck arthritis.  Since January has had ongoing neck pain and it is waking her from sleep. + hair loss and breakage  This visit occurred during the SARS-CoV-2 public health emergency.  Safety protocols were in place, including screening questions prior to the visit, additional usage of staff PPE, and extensive cleaning of exam room while observing appropriate contact  time as indicated for disinfecting solutions.      Objective:   Physical Exam General Appearance:    Alert, cooperative, no distress, appears stated age  Head:    Normocephalic, without obvious abnormality, atraumatic  Eyes:    PERRL, conjunctiva/corneas clear, EOM's intact, fundi    benign, both eyes  Ears:    Normal TM's and external ear canals, both ears  Nose:   Nares normal, septum midline, mucosa normal, no drainage    or sinus tenderness  Throat:   Deferred due to COVID  Neck:   Supple, symmetrical, trachea midline, no adenopathy;    Thyroid: no enlargement/tenderness/nodules  Back:     Symmetric, no curvature, ROM normal, no CVA tenderness  Lungs:     Clear to auscultation bilaterally, respirations unlabored  Chest Wall:    No tenderness or deformity   Heart:    Regular rate and rhythm, S1 and S2 normal, no murmur, rub   or gallop  Breast Exam:    Deferred to GYN  Abdomen:     Soft, non-tender, bowel sounds active all four quadrants,    no masses, no organomegaly  Genitalia:    Deferred to GYN  Rectal:    Extremities:   Extremities normal, atraumatic, no cyanosis or edema  Pulses:   2+ and symmetric all extremities  Skin:   Skin color, texture, turgor normal, no rashes or lesions  Lymph nodes:   Cervical, supraclavicular, and axillary nodes normal  Neurologic:   CNII-XII intact, normal strength, sensation and reflexes    throughout          Assessment & Plan:

## 2022-01-03 NOTE — Assessment & Plan Note (Signed)
Chronic problem.  Currently on Atorvastatin 10mg  w/o difficulty.  Check labs.  Adjust meds prn

## 2022-01-06 LAB — HEPATITIS C ANTIBODY
Hepatitis C Ab: NONREACTIVE
SIGNAL TO CUT-OFF: 0.04 (ref <1.00)

## 2022-01-06 LAB — HIV ANTIBODY (ROUTINE TESTING W REFLEX): HIV 1&2 Ab, 4th Generation: NONREACTIVE

## 2022-03-19 ENCOUNTER — Other Ambulatory Visit: Payer: Self-pay | Admitting: Family Medicine

## 2022-05-12 ENCOUNTER — Other Ambulatory Visit: Payer: Self-pay

## 2022-05-12 ENCOUNTER — Telehealth (INDEPENDENT_AMBULATORY_CARE_PROVIDER_SITE_OTHER): Payer: No Typology Code available for payment source | Admitting: Physician Assistant

## 2022-05-12 ENCOUNTER — Encounter: Payer: Self-pay | Admitting: Physician Assistant

## 2022-05-12 ENCOUNTER — Telehealth: Payer: Self-pay | Admitting: Family Medicine

## 2022-05-12 DIAGNOSIS — U071 COVID-19: Secondary | ICD-10-CM | POA: Diagnosis not present

## 2022-05-12 MED ORDER — NIRMATRELVIR/RITONAVIR (PAXLOVID)TABLET: 3.0000 | ORAL_TABLET | Freq: Two times a day (BID) | ORAL | 0 refills | Status: AC

## 2022-05-12 MED ORDER — POLYMYXIN B-TRIMETHOPRIM 10000-0.1 UNIT/ML-% OP SOLN: 1.0000 [drp] | OPHTHALMIC | 0 refills | Status: DC

## 2022-05-12 NOTE — Progress Notes (Signed)
Virtual Visit via Video   I connected with Renee Black on 05/12/22 at  1:40 PM EDT by a video enabled telemedicine application and verified that I am speaking with the correct person using two identifiers. Location patient: Home Location provider: [facility], Office Persons participating in the virtual visit: Renee Black, [place] PA-C  I discussed the limitations of evaluation and management by telemedicine and the availability of in person appointments. The patient expressed understanding and agreed to proceed.  Subjective:   HPI:   Patient is requesting evaluation for possible COVID-19.  Symptom onset: last night; fatigue  Travel/contacts: [place] this weekend with brother who tested positive  Vaccination status: has had at least 2 doses of Pfizer  Testing results: positive home test this AM  Patient endorses the following symptoms: Cough, sinus congestion, body aches, fatigue  Patient denies the following symptoms: Chest pain, SOB, LE swelling  Treatments tried: Hydration, NSAIDS  Patient risk factors: Current COVID-19 risk of complications score: 0 Smoking status: Renee Black  reports that she quit smoking about 24 years ago. Her smoking use included cigarettes. She has never used smokeless tobacco. If female, currently pregnant? []   Yes []   No  ROS: See pertinent positives and negatives per HPI.  Patient Active Problem List   Diagnosis Date Noted   Chronic vaginitis 10/08/2021   Low libido 10/08/2021   Menopause 10/08/2021   Pain in female genitalia on intercourse 10/08/2021   Subclinical hypothyroidism 10/08/2021   Vaginal dryness 10/08/2021   Drug-induced constipation 06/29/2020   Family history of early CAD 09/07/2019   HSV-1 (herpes simplex virus 1) infection 06/25/2018   Physical exam 12/02/2017   Hyperlipidemia 12/02/2017    Social History   Tobacco Use   Smoking status: Former    Types: Cigarettes    Quit date:  12/02/1997    Years since quitting: 24.4   Smokeless tobacco: Never   Tobacco comments:    Stopped 20 years ago  Substance Use Topics   Alcohol use: Yes    Comment: Wine daily.     Current Outpatient Medications:    atorvastatin (LIPITOR) 10 MG tablet, TAKE 1 TABLET BY MOUTH EVERY DAY, Disp: 90 tablet, Rfl: 1   fluticasone (FLONASE) 50 MCG/ACT nasal spray, Place 2 sprays into both nostrils daily., Disp: , Rfl:    ibuprofen (ADVIL,MOTRIN) 800 MG tablet, ibuprofen 800 mg tablet, Disp: , Rfl:    nirmatrelvir/ritonavir EUA (PAXLOVID) 20 x 150 MG & 10 x 100MG  TABS, Take 3 tablets by mouth 2 (two) times daily for 5 days. (Take nirmatrelvir 150 mg two tablets twice daily for 5 days and ritonavir 100 mg one tablet twice daily for 5 days), Disp: 30 tablet, Rfl: 0   progesterone (PROMETRIUM) 100 MG capsule, Take 100 mg by mouth at bedtime., Disp: , Rfl:    thyroid (NP THYROID) 15 MG tablet, Take 15 mg by mouth daily., Disp: , Rfl:    thyroid (NP THYROID) 30 MG tablet, Take 30 mg by mouth daily before breakfast., Disp: , Rfl:    trimethoprim-polymyxin b (POLYTRIM) ophthalmic solution, Place 1 drop into both eyes every 4 (four) hours., Disp: 10 mL, Rfl: 0   albuterol (VENTOLIN HFA) 108 (90 Base) MCG/ACT inhaler, Inhale 2 puffs into the lungs every 6 (six) hours as needed for wheezing or shortness of breath. (Patient not taking: Reported on 05/12/2022), Disp: 8 g, Rfl: 0   valACYclovir (VALTREX) 1000 MG tablet, TAKE 2 TABLETS BY MOUTH EVERY  TWELVE HOURS FOR 1 DAY AT ONSET OF FEVER blister symptoms (Patient not taking: Reported on 05/12/2022), Disp: , Rfl:   Allergies  Allergen Reactions   Augmentin [Amoxicillin-Pot Clavulanate] Diarrhea and Nausea And Vomiting    Pt had to be treated at ED   Codeine Itching   Hydrocodone Other (See Comments)   Hydrocortisone Itching   Sulfa Antibiotics Itching    Objective:   VITALS: Per patient if applicable, see vitals. GENERAL: Alert, appears well and in no acute  distress. HEENT: Atraumatic, conjunctiva clear, no obvious abnormalities on inspection of external nose and ears. NECK: Normal movements of the head and neck. CARDIOPULMONARY: No increased WOB. Speaking in clear sentences. I:E ratio WNL.  MS: Moves all visible extremities without noticeable abnormality. PSYCH: Pleasant and cooperative, well-groomed. Speech normal rate and rhythm. Affect is appropriate. Insight and judgement are appropriate. Attention is focused, linear, and appropriate.  NEURO: CN grossly intact. Oriented as arrived to appointment on time with no prompting. Moves both UE equally.  SKIN: No obvious lesions, wounds, erythema, or cyanosis noted on face or hands.  Assessment and Plan:   Keilany was seen today for covid exposure.  Diagnoses and all orders for this visit:  COVID-19  Other orders -     nirmatrelvir/ritonavir EUA (PAXLOVID) 20 x 150 MG & 10 x 100MG  TABS; Take 3 tablets by mouth 2 (two) times daily for 5 days. (Take nirmatrelvir 150 mg two tablets twice daily for 5 days and ritonavir 100 mg one tablet twice daily for 5 days) -     trimethoprim-polymyxin b (POLYTRIM) ophthalmic solution; Place 1 drop into both eyes every 4 (four) hours.    No red flags on discussion, patient is not in any obvious distress during our visit. Discussed progression of most viral illnesses, and recommended supportive care at this point in time.  Patient is interested in Paxlovid. I have sent this in for patient and also discussed that this medication is still under the FDA EUA and full long-term side effect profile is unknown.  Benefits/risks of medication discussed.  Patient currently has no contraindications for taking this medication.  We reviewed current medications and most recent GFR.  They are aware of risks of medication and wishes to proceed. I advised that this medication has numerous potential drug interactions and they should confirm with their pharmacist that this  medication is safe to take with their current prescriptions prior to starting. Recommended holding lipitor x 12 hours prior to start paxlovid as well as holding for 5 days after completion. Also hold progesterone while on this medication  I did also provide pocket rx for polytrim should eye redness/crusting worsen. Discussed over the counter supportive care options, including Tylenol 500 mg q 8 hours, with recommendations to push fluids and rest. Reviewed return precautions including new/worsening fever, SOB, new/worsening cough, sudden onset changes of symptoms. Recommended need to self-quarantine and practice social distancing until symptoms resolve. I recommend that patient follow-up if symptoms worsen or persist despite treatment x 7-10 days, sooner if needed.  I discussed the assessment and treatment plan with the patient. The patient was provided an opportunity to ask questions and all were answered. The patient agreed with the plan and demonstrated an understanding of the instructions.   The patient was advised to call back or seek an in-person evaluation if the symptoms worsen or if the condition fails to improve as anticipated.    [place], [place] 05/12/2022

## 2022-05-12 NOTE — Patient Instructions (Signed)
° ° ° °  If you have lab work done today you will be contacted with your lab results within the next 2 weeks.  If you have not heard from us then please contact us. The fastest way to get your results is to register for My Chart. ° ° °IF you received an x-ray today, you will receive an invoice from [facility]. Please contact [facility] at [phone] with questions or concerns regarding your invoice.  ° °IF you received labwork today, you will receive an invoice from [facility]. Please contact [facility] at [phone] with questions or concerns regarding your invoice.  ° °Our billing staff will not be able to assist you with questions regarding bills from these companies. ° °You will be contacted with the lab results as soon as they are available. The fastest way to get your results is to activate your My Chart account. Instructions are located on the last page of this paperwork. If you have not heard from us regarding the results in 2 weeks, please contact this office. °  ° ° ° °

## 2022-05-15 ENCOUNTER — Encounter: Payer: Self-pay | Admitting: Physician Assistant

## 2022-06-23 NOTE — Telephone Encounter (Signed)
error 

## 2022-10-06 ENCOUNTER — Other Ambulatory Visit: Payer: Self-pay | Admitting: Family Medicine

## 2022-10-06 DIAGNOSIS — Z1231 Encounter for screening mammogram for malignant neoplasm of breast: Secondary | ICD-10-CM

## 2022-10-29 ENCOUNTER — Ambulatory Visit (INDEPENDENT_AMBULATORY_CARE_PROVIDER_SITE_OTHER): Payer: Self-pay | Admitting: Family Medicine

## 2022-10-29 ENCOUNTER — Encounter: Payer: Self-pay | Admitting: Family Medicine

## 2022-10-29 VITALS — BP 124/80 | HR 84 | Temp 97.8°F | Resp 16 | Ht 67.5 in | Wt 150.2 lb

## 2022-10-29 DIAGNOSIS — J069 Acute upper respiratory infection, unspecified: Secondary | ICD-10-CM

## 2022-10-29 MED ORDER — PREDNISONE 10 MG PO TABS: ORAL_TABLET | ORAL | 0 refills | Status: DC

## 2022-10-29 NOTE — Progress Notes (Signed)
   Subjective:    Patient ID: Renee Black, female    DOB: 1966-01-21, 56 y.o.   MRN: [id number]  HPI URI- sxs started ~3 weeks ago.  + sinus pressure- maxillary and behind her eyes.  Unable to smell or taste.  Did not take a COVID test.  10 days ago developed 'cold' sxs- which is when she lost taste and smell.  No fevers/chills.  Denies body aches.  R ear pain.  Denies HA.     Review of Systems For ROS see HPI     Objective:   Physical Exam Vitals reviewed.  Constitutional:      General: She is not in acute distress.    Appearance: Normal appearance. She is well-developed. She is not ill-appearing.  HENT:     Head: Normocephalic and atraumatic.     Right Ear: Tympanic membrane and ear canal normal.     Left Ear: Tympanic membrane and ear canal normal.     Nose: Mucosal edema and congestion present. No rhinorrhea.     Right Sinus: No maxillary sinus tenderness or frontal sinus tenderness.     Left Sinus: No maxillary sinus tenderness or frontal sinus tenderness.     Mouth/Throat:     Pharynx: Posterior oropharyngeal erythema (w/ PND) present.  Eyes:     Conjunctiva/sclera: Conjunctivae normal.     Pupils: Pupils are equal, round, and reactive to light.  Cardiovascular:     Rate and Rhythm: Normal rate and regular rhythm.     Heart sounds: Normal heart sounds.  Pulmonary:     Effort: Pulmonary effort is normal. No respiratory distress.     Breath sounds: Normal breath sounds. No wheezing or rales.  Musculoskeletal:     Cervical back: Normal range of motion and neck supple.  Lymphadenopathy:     Cervical: No cervical adenopathy.  Skin:    General: Skin is warm and dry.  Neurological:     General: No focal deficit present.     Mental Status: She is alert and oriented to person, place, and time.  Psychiatric:        Mood and Affect: Mood normal.        Behavior: Behavior normal.        Thought Content: Thought content normal.           Assessment & Plan:  URI-  new.  Pt likely had COVID 10 days ago when her sxs changed and she lost taste and smell.  No evidence of bacterial infxn that would require abx but ongoing congestion and inflammation.  Start Prednisone taper and continue supportive care.  Pt expressed understanding and is in agreement w/ plan.

## 2022-10-29 NOTE — Patient Instructions (Signed)
Follow up as needed or as scheduled START the Prednisone as directed- take w/ food.  3 pills at the same time x3 days and then 2 pills at the same time x3 days, and then 1 pill daily Drink LOTS of fluids REST! Continue Netti pot, allergy pills, Flonase, Mucinex to thin congestion Call with any questions or concerns Hang in there!!! Happy Holidays!!!

## 2022-11-04 ENCOUNTER — Telehealth: Payer: Self-pay

## 2022-12-01 ENCOUNTER — Ambulatory Visit
Admission: RE | Admit: 2022-12-01 | Discharge: 2022-12-01 | Disposition: A | Discharge status: Home / Self Care | Payer: 59 | Source: Ambulatory Visit | Attending: Family Medicine | Admitting: Family Medicine

## 2022-12-01 DIAGNOSIS — Z1231 Encounter for screening mammogram for malignant neoplasm of breast: Secondary | ICD-10-CM

## 2022-12-10 ENCOUNTER — Ambulatory Visit (INDEPENDENT_AMBULATORY_CARE_PROVIDER_SITE_OTHER): Payer: 59 | Admitting: Family Medicine

## 2022-12-10 ENCOUNTER — Encounter: Payer: Self-pay | Admitting: Family Medicine

## 2022-12-10 VITALS — BP 124/70 | HR 94 | Temp 97.1°F | Resp 16 | Ht 67.5 in | Wt 151.1 lb

## 2022-12-10 DIAGNOSIS — R0981 Nasal congestion: Secondary | ICD-10-CM | POA: Diagnosis not present

## 2022-12-10 MED ORDER — MONTELUKAST SODIUM 10 MG PO TABS: 10.0000 mg | ORAL_TABLET | Freq: Every day | ORAL | 3 refills | Status: DC

## 2022-12-10 MED ORDER — AZELASTINE HCL 0.1 % NA SOLN: 2.0000 | Freq: Two times a day (BID) | NASAL | 12 refills | Status: DC

## 2022-12-10 MED ORDER — ALBUTEROL SULFATE HFA 108 (90 BASE) MCG/ACT IN AERS: 2.0000 | INHALATION_SPRAY | Freq: Four times a day (QID) | RESPIRATORY_TRACT | 0 refills | Status: DC | PRN

## 2022-12-10 NOTE — Patient Instructions (Addendum)
Follow up as needed or as scheduled CONTINUE your daily antihistamine (Claritin, Zyrtec) ADD the Montelukast (Singulair) nightly USE the nasal spray daily ADD the Azelastine twice daily to decrease congestion and drainage Drink LOTS of water to rinse the drainage off your throat Call with any questions or concerns Hang in there!

## 2022-12-10 NOTE — Progress Notes (Signed)
   Subjective:    Patient ID: Renee Black, female    DOB: Nov 30, 1965, 57 y.o.   MRN: [id number]  HPI Cough/congestion- pt reports 2 months of sxs.  Pt was tx'd w/ Prednisone but sxs worsened.  Went to UC and got abx- initially Augmentin but she did not tolerate and was then switched to Doxycycline x5 days.  No relief w/ Mucinex.  Continues to use Flonase. Taking a daily OTC antihistamine.  Pt is using Netti pot regularly and return is clear but when she blows her nose, it is bloody.  Denies facial pain/pressure.  Denies HA.  No fevers or chills.  Kitchen is being remodeled which may be stirring up issues.  Near constant throat clearing.  'i don't feel well'.  No known sick contacts.   Review of Systems For ROS see HPI     Objective:   Physical Exam Vitals reviewed.  Constitutional:      General: She is not in acute distress.    Appearance: Normal appearance. She is well-developed. She is not ill-appearing.  HENT:     Head: Normocephalic and atraumatic.     Right Ear: Tympanic membrane normal.     Left Ear: Tympanic membrane normal.     Nose: Mucosal edema and congestion present. No rhinorrhea.     Right Sinus: No maxillary sinus tenderness or frontal sinus tenderness.     Left Sinus: No maxillary sinus tenderness or frontal sinus tenderness.     Mouth/Throat:     Mouth: Mucous membranes are moist.     Pharynx: Posterior oropharyngeal erythema (w/ PND) present. No oropharyngeal exudate.  Eyes:     Conjunctiva/sclera: Conjunctivae normal.     Pupils: Pupils are equal, round, and reactive to light.  Cardiovascular:     Rate and Rhythm: Normal rate and regular rhythm.     Heart sounds: Normal heart sounds.  Pulmonary:     Effort: Pulmonary effort is normal. No respiratory distress.     Breath sounds: Normal breath sounds. No wheezing or rales.  Musculoskeletal:     Cervical back: Normal range of motion and neck supple.  Lymphadenopathy:     Cervical: No cervical adenopathy.   Skin:    General: Skin is warm and dry.  Neurological:     General: No focal deficit present.     Mental Status: She is alert and oriented to person, place, and time.  Psychiatric:        Mood and Affect: Mood normal.        Behavior: Behavior normal.        Thought Content: Thought content normal.           Assessment & Plan:  Nasal congestion- deteriorated.  Pt reports 2 months of sxs.  Initially tx'd w/ Prednisone but then worsened.  So tx'd w/ abx for sinus infxn.  Now continue to have congestion, copious PND, near constant throat clearing.  Denies sinus pain, fever, body aches, ear pain.  Suspect allergy component given recent remodeling.  Will add Azelastine and Singulair to her nasal steroid and daily antihistamine.  Reviewed supportive care and red flags that should prompt return.  Pt expressed understanding and is in agreement w/ plan.

## 2023-01-09 ENCOUNTER — Ambulatory Visit (INDEPENDENT_AMBULATORY_CARE_PROVIDER_SITE_OTHER): Payer: 59 | Admitting: Family Medicine

## 2023-01-09 ENCOUNTER — Encounter: Payer: Self-pay | Admitting: Family Medicine

## 2023-01-09 VITALS — BP 124/70 | HR 83 | Temp 97.9°F | Resp 18 | Ht 67.5 in | Wt 150.2 lb

## 2023-01-09 DIAGNOSIS — Z0000 Encounter for general adult medical examination without abnormal findings: Secondary | ICD-10-CM | POA: Diagnosis not present

## 2023-01-09 DIAGNOSIS — E782 Mixed hyperlipidemia: Secondary | ICD-10-CM | POA: Diagnosis not present

## 2023-01-09 MED ORDER — MONTELUKAST SODIUM 10 MG PO TABS: 10.0000 mg | ORAL_TABLET | Freq: Every day | ORAL | 3 refills | Status: DC

## 2023-01-09 NOTE — Progress Notes (Signed)
   Subjective:    Patient ID: Renee Black, female    DOB: Jun 11, 1966, 57 y.o.   MRN: [id number]  HPI CPE- UTD on pap, mammo, colonosocpy, Tdap, flu, shingles  Patient Care Team    Relationship Specialty Notifications Start End  Midge Minium, MD PCP - General Family Medicine  12/02/17   Clarene Essex, MD Consulting Physician Gastroenterology  12/02/17   Servando Salina, MD Consulting Physician Obstetrics and Gynecology  12/27/19     Health Maintenance  Topic Date Due   MAMMOGRAM  12/01/2024   PAP SMEAR-Modifier  04/29/2025   COLONOSCOPY (Pts 45-52yr Insurance coverage will need to be confirmed)  10/30/2026   DTaP/Tdap/Td (2 - Td or Tdap) 12/22/2028   INFLUENZA VACCINE  Completed   Hepatitis C Screening  Completed   HIV Screening  Completed   Zoster Vaccines- Shingrix  Completed   HPV VACCINES  Aged Out   COVID-19 Vaccine  Discontinued      Review of Systems Patient reports no vision/ hearing changes, adenopathy,fever, weight change,  persistant/recurrent hoarseness , swallowing issues, chest pain, palpitations, edema, persistant/recurrent cough, hemoptysis, dyspnea (rest/exertional/paroxysmal nocturnal), gastrointestinal bleeding (melena, rectal bleeding), abdominal pain, significant heartburn, bowel changes, GU symptoms (dysuria, hematuria, incontinence), Gyn symptoms (abnormal  bleeding, pain),  syncope, focal weakness, memory loss, numbness & tingling, skin/hair/nail changes, abnormal bruising or bleeding, anxiety, or depression.   + sinus congestion and pressure.  Continues to have blood when blowing her nose.  Sleeping w/ humidifier.  Had stopped Singulair when feeling better.      Objective:   Physical Exam General Appearance:    Alert, cooperative, no distress, appears stated age  Head:    Normocephalic, without obvious abnormality, atraumatic  Eyes:    PERRL, conjunctiva/corneas clear, EOM's intact both eyes  Ears:    Normal TM's and external ear canals, both  ears  Nose:   Nares normal, septum midline, mucosa normal, no drainage    or sinus tenderness  Throat:   Lips, mucosa, and tongue normal; teeth and gums normal  Neck:   Supple, symmetrical, trachea midline, no adenopathy;    Thyroid: no enlargement/tenderness/nodules  Back:     Symmetric, no curvature, ROM normal, no CVA tenderness  Lungs:     Clear to auscultation bilaterally, respirations unlabored  Chest Wall:    No tenderness or deformity   Heart:    Regular rate and rhythm, S1 and S2 normal, no murmur, rub   or gallop  Breast Exam:    Deferred to GYN  Abdomen:     Soft, non-tender, bowel sounds active all four quadrants,    no masses, no organomegaly  Genitalia:    Deferred to GYN  Rectal:    Extremities:   Extremities normal, atraumatic, no cyanosis or edema  Pulses:   2+ and symmetric all extremities  Skin:   Skin color, texture, turgor normal, no rashes or lesions  Lymph nodes:   Cervical, supraclavicular, and axillary nodes normal  Neurologic:   CNII-XII intact, normal strength, sensation and reflexes    throughout          Assessment & Plan:

## 2023-01-09 NOTE — Assessment & Plan Note (Signed)
Pt's PE WNL.  UTD on pap, mammo, colonoscopy, Tdap, flu, shingles.  Check labs.  Anticipatory guidance provided.

## 2023-01-09 NOTE — Assessment & Plan Note (Signed)
Chronic problem.  Tolerating statin w/o difficulty.  Check labs.  Adjust meds prn  

## 2023-01-09 NOTE — Patient Instructions (Signed)
Follow up in 6 months to recheck cholesterol We'll notify you of your lab results and make any changes if needed RESTART the Montelukast nightly Continue to take a daily allergy medication USE the Flonase and Azelastine as directed Keep up the good work on healthy diet and regular exercise- you look great! Call with any questions or concerns Happy Spring!!!

## 2023-01-10 LAB — CBC WITH DIFFERENTIAL/PLATELET
Absolute Monocytes: 632 cells/uL (ref 200–950)
Basophils Absolute: 37 cells/uL (ref 0–200)
Basophils Relative: 0.4 %
Eosinophils Absolute: 47 cells/uL (ref 15–500)
Eosinophils Relative: 0.5 %
HCT: 40.4 % (ref 35.0–45.0)
Hemoglobin: 13.6 g/dL (ref 11.7–15.5)
Lymphs Abs: 2167 cells/uL (ref 850–3900)
MCH: 30.6 pg (ref 27.0–33.0)
MCHC: 33.7 g/dL (ref 32.0–36.0)
MCV: 91 fL (ref 80.0–100.0)
MPV: 9.9 fL (ref 7.5–12.5)
Monocytes Relative: 6.8 %
Neutro Abs: 6417 cells/uL (ref 1500–7800)
Neutrophils Relative %: 69 %
Platelets: 309 10*3/uL (ref 140–400)
RBC: 4.44 10*6/uL (ref 3.80–5.10)
RDW: 13.1 % (ref 11.0–15.0)
Total Lymphocyte: 23.3 %
WBC: 9.3 10*3/uL (ref 3.8–10.8)

## 2023-01-10 LAB — LIPID PANEL
Cholesterol: 197 mg/dL (ref <200)
HDL: 95 mg/dL (ref >=50)
LDL Cholesterol (Calc): 77 mg/dL (calc)
Non-HDL Cholesterol (Calc): 102 mg/dL (calc) (ref <130)
Total CHOL/HDL Ratio: 2.1 (calc) (ref <5.0)
Triglycerides: 155 mg/dL — ABNORMAL HIGH (ref <150)

## 2023-01-10 LAB — BASIC METABOLIC PANEL
BUN: 14 mg/dL (ref 7–25)
CO2: 26 mmol/L (ref 20–32)
Calcium: 10 mg/dL (ref 8.6–10.4)
Chloride: 100 mmol/L (ref 98–110)
Creat: 0.74 mg/dL (ref 0.50–1.03)
Glucose, Bld: 84 mg/dL (ref 65–99)
Potassium: 4.4 mmol/L (ref 3.5–5.3)
Sodium: 138 mmol/L (ref 135–146)

## 2023-01-10 LAB — HEPATIC FUNCTION PANEL
AG Ratio: 1.5 (calc) (ref 1.0–2.5)
ALT: 15 U/L (ref 6–29)
AST: 20 U/L (ref 10–35)
Albumin: 4.4 g/dL (ref 3.6–5.1)
Alkaline phosphatase (APISO): 55 U/L (ref 37–153)
Bilirubin, Direct: 0.1 mg/dL (ref 0.0–0.2)
Globulin: 3 g/dL (calc) (ref 1.9–3.7)
Indirect Bilirubin: 0.4 mg/dL (calc) (ref 0.2–1.2)
Total Bilirubin: 0.5 mg/dL (ref 0.2–1.2)
Total Protein: 7.4 g/dL (ref 6.1–8.1)

## 2023-01-10 LAB — TSH: TSH: 0.14 mIU/L — ABNORMAL LOW (ref 0.40–4.50)

## 2023-05-20 ENCOUNTER — Other Ambulatory Visit: Payer: Self-pay

## 2023-05-20 MED ORDER — ATORVASTATIN CALCIUM 10 MG PO TABS: 10.0000 mg | ORAL_TABLET | Freq: Every day | ORAL | 1 refills | Status: DC

## 2023-05-29 ENCOUNTER — Encounter: Payer: Self-pay | Admitting: Family Medicine

## 2023-05-29 ENCOUNTER — Ambulatory Visit (INDEPENDENT_AMBULATORY_CARE_PROVIDER_SITE_OTHER): Payer: 59 | Admitting: Family Medicine

## 2023-05-29 VITALS — BP 120/78 | HR 89 | Temp 97.9°F | Resp 17 | Ht 67.5 in | Wt 147.0 lb

## 2023-05-29 DIAGNOSIS — M62838 Other muscle spasm: Secondary | ICD-10-CM

## 2023-05-29 DIAGNOSIS — H8110 Benign paroxysmal vertigo, unspecified ear: Secondary | ICD-10-CM | POA: Diagnosis not present

## 2023-05-29 MED ORDER — MECLIZINE HCL 25 MG PO TABS: 25.0000 mg | ORAL_TABLET | Freq: Three times a day (TID) | ORAL | 0 refills | Status: DC | PRN

## 2023-05-29 NOTE — Progress Notes (Unsigned)
   Subjective:    Patient ID: Renee Black, female    DOB: [birth year], 57 y.o.   MRN: [id number]  HPI Dizziness- sxs started ~2 months ago.  This was around the time she started working out and lifting weight.  1 episode she was on the floor and she turned her head and the room was spinning.  Notes that when she turns she gets a bit light headed.  Is happening fairly frequently.  Dizziness seems to always involve turning  Tingling of L upper arm- occurs independently of dizziness.  Started ~2 months ago.  Sxs are most notable when in a sitting position.   Review of Systems For ROS see HPI     Objective:   Physical Exam Vitals reviewed.  Constitutional:      General: She is not in acute distress.    Appearance: Normal appearance. She is well-developed. She is not ill-appearing.  HENT:     Head: Normocephalic and atraumatic.     Right Ear: Tympanic membrane and ear canal normal.     Left Ear: Tympanic membrane and ear canal normal.     Nose: No congestion or rhinorrhea.     Mouth/Throat:     Mouth: Mucous membranes are normal.     Pharynx: Uvula midline.  Eyes:     Extraocular Movements: EOM normal.     Conjunctiva/sclera: Conjunctivae normal.     Pupils: Pupils are equal, round, and reactive to light.     Comments: 2-3 beats of horizontal nystagmus  Neck:     Comments: L trap spasm Cardiovascular:     Rate and Rhythm: Normal rate and regular rhythm.     Heart sounds: Normal heart sounds.  Pulmonary:     Effort: Pulmonary effort is normal. No respiratory distress.     Breath sounds: Normal breath sounds. No wheezing or rales.  Musculoskeletal:        General: No edema.     Cervical back: Normal range of motion and neck supple.  Lymphadenopathy:     Cervical: No cervical adenopathy.  Skin:    General: Skin is warm and dry.  Neurological:     General: No focal deficit present.     Mental Status: She is alert and oriented to person, place, and time.     Cranial Nerves:  No cranial nerve deficit.     Motor: No weakness.     Gait: Gait normal.     Deep Tendon Reflexes: Reflexes are normal and symmetric. Reflexes normal.     Comments: + Gilberto Better  Psychiatric:        Mood and Affect: Mood and affect normal.        Behavior: Behavior normal.        Thought Content: Thought content normal.        Judgment: Judgment normal.           Assessment & Plan:  BPV- new.  Pt's description of dizziness that always involves turning is consistent w/ BPV.  As is the horizontal nystagmus seen on PE.  Encouraged increased fluid intake, meclizine prn, handout provided on modified Eppley maneuver.  If no improvement, will refer to neuro or consider neuro rehab.  Pt expressed understanding and is in agreement w/ plan.   Trap spasm- new.  L sided.  Suspect this is the tingling she is experiencing in her upper arm.  Encouraged heating pad, gentle stretching, and massage.  Will follow.

## 2023-05-29 NOTE — Patient Instructions (Signed)
Follow up as needed or as scheduled (you only need 1 appt in Sept) INCREASE your water intake Change positions slowly Do the Eppley Maneuver as shown on the handout TAKE the Meclizine as needed for dizziness If no improvement after 5-7 days of exercises, let me know so we can do prednisone and refer to Neuro for a complete evaluation Use ibuprofen for the muscle spasm Heat and massage will help w/ the spasm, too Call with any questions or concerns Hang in there!

## 2023-05-29 NOTE — Telephone Encounter (Signed)
Pt asking if her ear plug use is the cause for her issues please advise

## 2023-06-04 MED ORDER — PREDNISONE 10 MG PO TABS: ORAL_TABLET | ORAL | 0 refills | Status: DC

## 2023-06-04 MED ORDER — MECLIZINE HCL 25 MG PO TABS: 25.0000 mg | ORAL_TABLET | Freq: Three times a day (TID) | ORAL | 0 refills | Status: DC | PRN

## 2023-06-25 ENCOUNTER — Encounter (INDEPENDENT_AMBULATORY_CARE_PROVIDER_SITE_OTHER): Payer: Self-pay

## 2023-07-07 ENCOUNTER — Encounter: Payer: Self-pay | Admitting: Family Medicine

## 2023-07-16 ENCOUNTER — Encounter: Payer: Self-pay | Admitting: Family Medicine

## 2023-07-16 ENCOUNTER — Other Ambulatory Visit (HOSPITAL_COMMUNITY)
Admission: RE | Admit: 2023-07-16 | Discharge: 2023-07-16 | Disposition: A | Discharge status: Home / Self Care | Payer: 59 | Source: Ambulatory Visit | Attending: Family Medicine | Admitting: Family Medicine

## 2023-07-16 ENCOUNTER — Ambulatory Visit (INDEPENDENT_AMBULATORY_CARE_PROVIDER_SITE_OTHER): Payer: 59 | Admitting: Family Medicine

## 2023-07-16 VITALS — BP 116/70 | HR 94 | Temp 98.4°F | Resp 18 | Ht 67.5 in | Wt 150.4 lb

## 2023-07-16 DIAGNOSIS — Z124 Encounter for screening for malignant neoplasm of cervix: Secondary | ICD-10-CM

## 2023-07-16 DIAGNOSIS — Z23 Encounter for immunization: Secondary | ICD-10-CM

## 2023-07-16 DIAGNOSIS — E782 Mixed hyperlipidemia: Secondary | ICD-10-CM

## 2023-07-16 LAB — BASIC METABOLIC PANEL
BUN: 16 mg/dL (ref 6–23)
CO2: 27 meq/L (ref 19–32)
Calcium: 9.7 mg/dL (ref 8.4–10.5)
Chloride: 101 meq/L (ref 96–112)
Creatinine, Ser: 0.79 mg/dL (ref 0.40–1.20)
GFR: 82.54 mL/min (ref >60.00)
Glucose, Bld: 88 mg/dL (ref 70–99)
Potassium: 4.3 meq/L (ref 3.5–5.1)
Sodium: 137 meq/L (ref 135–145)

## 2023-07-16 LAB — HEPATIC FUNCTION PANEL
ALT: 12 U/L (ref 0–35)
AST: 20 U/L (ref 0–37)
Albumin: 4 g/dL (ref 3.5–5.2)
Alkaline Phosphatase: 54 U/L (ref 39–117)
Bilirubin, Direct: 0.1 mg/dL (ref 0.0–0.3)
Total Bilirubin: 0.5 mg/dL (ref 0.2–1.2)
Total Protein: 7 g/dL (ref 6.0–8.3)

## 2023-07-16 LAB — LIPID PANEL
Cholesterol: 183 mg/dL (ref 0–200)
HDL: 93.8 mg/dL (ref >39.00)
LDL Cholesterol: 43 mg/dL (ref 0–99)
NonHDL: 88.8
Total CHOL/HDL Ratio: 2
Triglycerides: 228 mg/dL — ABNORMAL HIGH (ref 0.0–149.0)
VLDL: 45.6 mg/dL — ABNORMAL HIGH (ref 0.0–40.0)

## 2023-07-16 NOTE — Assessment & Plan Note (Signed)
Chronic problem.  Currently tolerating Lipitor 10mg  daily w/o difficulty.  Is asking for a calcium score to further risk stratify.  Will order.  Check labs.  Adjust meds prn

## 2023-07-16 NOTE — Progress Notes (Signed)
   Subjective:    Patient ID: Renee Black, female    DOB: 07/20/1966, 57 y.o.   MRN: [id number]  HPI Hyperlipidemia- chronic problem.  Currently on Lipitor 10mg  daily.  Denies abd pain, N/V, myalgias.    Pap smear- pt is here today for repeat pap.  She was previously seeing GYN.  Has hx of cervical polyp, vulvodynia, and chronic vaginitis.   Review of Systems For ROS see HPI     Objective:   Physical Exam Vitals reviewed. Exam conducted with a chaperone present.  Constitutional:      General: She is not in acute distress.    Appearance: Normal appearance. She is well-developed. She is not ill-appearing.  HENT:     Head: Normocephalic and atraumatic.  Eyes:     Conjunctiva/sclera: Conjunctivae normal.     Pupils: Pupils are equal, round, and reactive to light.  Neck:     Thyroid: No thyromegaly.  Cardiovascular:     Rate and Rhythm: Normal rate and regular rhythm.     Heart sounds: Normal heart sounds. No murmur heard. Pulmonary:     Effort: Pulmonary effort is normal. No respiratory distress.     Breath sounds: Normal breath sounds.  Abdominal:     General: There is no distension.     Palpations: Abdomen is soft.     Tenderness: There is no abdominal tenderness.  Genitourinary:    Labia:        Right: No rash, tenderness or lesion.        Left: No rash, tenderness or lesion.      Urethra: No prolapse.     Vagina: No signs of injury. No vaginal discharge, erythema, tenderness or bleeding.     Cervix: Normal. No cervical motion tenderness, discharge, friability, lesion, erythema or cervical bleeding.     Uterus: Normal. Not deviated, not enlarged, not fixed and not tender.      Adnexa: Right adnexa normal and left adnexa normal.     Rectum: Normal.  Musculoskeletal:     Cervical back: Normal range of motion and neck supple.  Lymphadenopathy:     Cervical: No cervical adenopathy.  Skin:    General: Skin is warm and dry.  Neurological:     Mental Status: She is  alert and oriented to person, place, and time.  Psychiatric:        Behavior: Behavior normal.           Assessment & Plan:  Pap- collected today.  Pt has no concerns.  PE WNL.  HPV co-testing ordered.

## 2023-07-16 NOTE — Patient Instructions (Signed)
Schedule your complete physical in 6 months We'll notify you of your lab and pap results Keep up the good work!  You look great! Call with any questions or concerns Stay Safe!  Stay Healthy! Happy Fall!

## 2023-07-17 ENCOUNTER — Telehealth: Payer: Self-pay

## 2023-07-17 LAB — CYTOLOGY - PAP
Comment: NEGATIVE
Diagnosis: NEGATIVE
High risk HPV: NEGATIVE

## 2023-07-17 NOTE — Telephone Encounter (Signed)
-----   Message from Neena Rhymes sent at 07/17/2023  7:28 AM EDT ----- Labs look great!  Triglycerides (fatty part of blood) are elevated but this usually occurs when you're not fasting.  No cause for concern

## 2023-07-17 NOTE — Telephone Encounter (Signed)
Left results on pt VM  

## 2023-07-20 ENCOUNTER — Telehealth: Payer: Self-pay

## 2023-07-20 NOTE — Telephone Encounter (Signed)
-----   Message from Neena Rhymes sent at 07/19/2023  9:10 AM EDT ----- Normal pap- great news!

## 2023-07-20 NOTE — Telephone Encounter (Signed)
Pt seen results Via my chart  

## 2023-07-30 ENCOUNTER — Ambulatory Visit: Payer: 59 | Admitting: Family Medicine

## 2023-08-07 ENCOUNTER — Telehealth: Payer: Self-pay

## 2023-08-07 MED ORDER — ATORVASTATIN CALCIUM 10 MG PO TABS: 10.0000 mg | ORAL_TABLET | Freq: Every day | ORAL | 1 refills | Status: DC

## 2023-08-07 NOTE — Telephone Encounter (Signed)
Medication: Atorvastatin 10 mg  Directions: Take 1 tablet by mouth daily  Last given: 05/20/23 Number refills: 1 Last o/v: 07/16/23 Follow up: 01/15/24 Labs:

## 2023-08-07 NOTE — Telephone Encounter (Signed)
Received a fax from [facility] requesting refills on Atorvastatin 10 mg.

## 2023-08-17 ENCOUNTER — Ambulatory Visit (HOSPITAL_BASED_OUTPATIENT_CLINIC_OR_DEPARTMENT_OTHER)
Admission: RE | Admit: 2023-08-17 | Discharge: 2023-08-17 | Disposition: A | Discharge status: Home / Self Care | Payer: 59 | Source: Ambulatory Visit | Attending: Family Medicine | Admitting: Family Medicine

## 2023-08-17 DIAGNOSIS — E782 Mixed hyperlipidemia: Secondary | ICD-10-CM | POA: Insufficient documentation

## 2023-08-18 ENCOUNTER — Telehealth: Payer: Self-pay

## 2023-08-18 ENCOUNTER — Other Ambulatory Visit (HOSPITAL_BASED_OUTPATIENT_CLINIC_OR_DEPARTMENT_OTHER): Payer: Self-pay | Admitting: Family Medicine

## 2023-08-18 DIAGNOSIS — R931 Abnormal findings on diagnostic imaging of heart and coronary circulation: Secondary | ICD-10-CM

## 2023-08-18 NOTE — Telephone Encounter (Signed)
Spoke to patient regarding results, and let her know that a referral had been placed and to where and provided her with the phone number in case she wanted to call and schedule herself. No questions at this time

## 2023-08-18 NOTE — Telephone Encounter (Signed)
-----   Message from Neena Rhymes sent at 08/18/2023  7:22 AM EDT ----- Your calcium score is elevated at 129.  This is the 91st percentile.  Based on this, you are at increased risk of coronary heart disease and worth referring to Cardiology to see if there are any other preventative measures we can put in place.  I will place that referral and they will call you to schedule.

## 2023-09-02 ENCOUNTER — Encounter: Payer: Self-pay | Admitting: Cardiology

## 2023-09-02 ENCOUNTER — Ambulatory Visit: Payer: 59 | Attending: Cardiology | Admitting: Cardiology

## 2023-09-02 VITALS — BP 126/74 | HR 94 | Ht 67.0 in | Wt 154.2 lb

## 2023-09-02 DIAGNOSIS — Z8249 Family history of ischemic heart disease and other diseases of the circulatory system: Secondary | ICD-10-CM | POA: Diagnosis not present

## 2023-09-02 DIAGNOSIS — I2510 Atherosclerotic heart disease of native coronary artery without angina pectoris: Secondary | ICD-10-CM

## 2023-09-02 DIAGNOSIS — R931 Abnormal findings on diagnostic imaging of heart and coronary circulation: Secondary | ICD-10-CM

## 2023-09-02 DIAGNOSIS — E785 Hyperlipidemia, unspecified: Secondary | ICD-10-CM

## 2023-09-02 MED ORDER — METOPROLOL TARTRATE 100 MG PO TABS: 100.0000 mg | ORAL_TABLET | Freq: Once | ORAL | 0 refills | Status: DC

## 2023-09-02 NOTE — Progress Notes (Signed)
Cardiology Office Note:  .   Date:  09/02/2023  ID:  Renee Black, DOB 1966/02/04, MRN [id number] PCP: Sheliah Hatch, MD  [facility] Providers Cardiologist:  Donato Schultz, MD     History of Present Illness: Marland Kitchen   Renee Black is a 57 y.o. female Discussed withthe use of AI scribe   History of Present Illness   A 57 year old patient with a history of early coronary artery disease in the family, including three paternal uncles who underwent CABG and a father who had a stent at age 77, presents for evaluation of an elevated coronary calcium score. The patient's brother had a major heart attack at age 7, which prompted the patient to seek cardiovascular evaluation.  On 08/23/23, the patient underwent a coronary calcium score that showed a score of 129, placing her in the 91st percentile. The calcium was located in the LAD. The patient's LDL was 43 on 07/16/23, and triglycerides were elevated at 228. The patient is currently on atorvastatin 10 mg once a day, along with progesterone, testosterone, and thyroid medication.  The patient reports a recent unusual sensation of tingling down the arm, described as feeling like a spider crawling. This symptom was previously attributed to nerve tightness due to stress carried in the shoulders. The patient denies any chest discomfort or unusual shortness of breath.  The patient's lifestyle includes regular exercise and a diet that avoids fast food. The patient is a non-smoker. The patient's blood pressure is well-controlled, and she is adhering to her current medication regimen, which includes daily aspirin and atorvastatin. The patient's LDL cholesterol is well-controlled at 43.          ROS: no syncope  Studies Reviewed: .        Results LABS LDL: 43 (07/16/2023) Triglycerides: 228 (07/16/2023)  RADIOLOGY Coronary calcium score: 129, 91st percentile, calcium in LAD (08/23/2023)  Risk Assessment/Calculations:             Physical Exam:   VS:  BP 126/74   Pulse 94   Ht 5\' 7"  (1.702 m)   Wt 154 lb 3.2 oz (69.9 kg)   SpO2 98%   BMI 24.15 kg/m    Wt Readings from Last 3 Encounters:  09/02/23 154 lb 3.2 oz (69.9 kg)  07/16/23 150 lb 6 oz (68.2 kg)  05/29/23 147 lb (66.7 kg)    GEN: Well nourished, well developed in no acute distress NECK: No JVD; No carotid bruits CARDIAC: RRR, no murmurs, no rubs, no gallops RESPIRATORY:  Clear to auscultation without rales, wheezing or rhonchi  ABDOMEN: Soft, non-tender, non-distended EXTREMITIES:  No edema; No deformity   ASSESSMENT AND PLAN: .    Assessment and Plan    Coronary Artery Disease Elevated coronary calcium score of 129 (91st percentile) in the LAD. LDL well controlled at 43 on Atorvastatin 10mg  daily. Noted family history of early coronary artery disease. No current symptoms suggestive of angina. -Continue Atorvastatin 10mg  daily. -If LP(a) is elevated, certainly we will consider increasing atorvastatin dosage despite excellent LDL. -Order coronary CT scan to further evaluate the extent of coronary artery disease.  Her left arm sensation is most likely neuropathic however cannot exclude potential anginal equivalent.  Hypertriglyceridemia Triglycerides elevated at 228. -Continue current lifestyle modifications and monitor.  Neuropathy Reports of transient tingling sensation in left arm, likely neuropathic in nature. -Monitor symptoms.  We will follow-up after results of studies            Signed,  Donato Schultz, MD

## 2023-09-02 NOTE — Patient Instructions (Addendum)
Medication Instructions:  Your physician recommends that you continue on your current medications as directed. Please refer to the Current Medication list given to you today.  *If you need a refill on your cardiac medications before your next appointment, please call your pharmacy*  Lab Work: TODAY: BMET and LPa  If you have labs (blood work) drawn today and your tests are completely normal, you will receive your results only by: MyChart Message (if you have MyChart) OR A paper copy in the mail If you have any lab test that is abnormal or we need to change your treatment, we will call you to review the results.  Testing/Procedures: Your physician has requested that you have cardiac CT. Cardiac computed tomography (CT) is a painless test that uses an x-ray machine to take clear, detailed pictures of your heart. Please follow instruction sheet as given.  Follow-Up: At [facility], you and your health needs are our priority.  As part of our continuing mission to provide you with exceptional heart care, we have created designated Provider Care Teams.  These Care Teams include your primary Cardiologist (physician) and Advanced Practice Providers (APPs -  Physician Assistants and Nurse Practitioners) who all work together to provide you with the care you need, when you need it.  Your next appointment:   As needed based on results of testing   Other Instructions   Your cardiac CT will be scheduled at:   [facility] [address] [place], [place] [address] [phone]  If scheduled at [facility], please arrive at the [facility] and Children's Entrance (Entrance C2) of [facility] 30 minutes prior to test start time. You can use the FREE valet parking offered at entrance C (encouraged to control the heart rate for the test)  Proceed to the [facility] Radiology Department (first floor) to check-in and test prep.  All radiology patients and guests  should use entrance C2 at [facility], accessed from [facility], even though the hospital's physical address listed is [address]     Please follow these instructions carefully (unless otherwise directed):  An IV will be required for this test and Nitroglycerin will be given.   On the Night Before the Test: Be sure to Drink plenty of water. Do not consume any caffeinated/decaffeinated beverages or chocolate 12 hours prior to your test. Do not take any antihistamines 12 hours prior to your test.  On the Day of the Test: Drink plenty of water until 1 hour prior to the test. Do not eat any food 1 hour prior to test. You may take your regular medications prior to the test.  Take metoprolol (Lopressor) two hours prior to test. FEMALES- please wear underwire-free bra if available, avoid dresses & tight clothing      After the Test: Drink plenty of water. After receiving IV contrast, you may experience a mild flushed feeling. This is normal. On occasion, you may experience a mild rash up to 24 hours after the test. This is not dangerous. If this occurs, you can take Benadryl 25 mg and increase your fluid intake. If you experience trouble breathing, this can be serious. If it is severe call 911 IMMEDIATELY. If it is mild, please call our office. If you take any of these medications: Glipizide/Metformin, Avandament, Glucavance, please do not take 48 hours after completing test unless otherwise instructed.  We will call to schedule your test 2-4 weeks out understanding that some insurance  companies will need an authorization prior to the service being performed.   For more information and frequently asked questions, please visit our website : [email or web address]  For non-scheduling related questions, please contact the cardiac imaging nurse navigator should you have any questions/concerns: Cardiac Imaging Nurse Navigators Direct Office Dial:  [phone]   For scheduling needs, including cancellations and rescheduling, please call [place], [phone].

## 2023-09-05 LAB — BASIC METABOLIC PANEL
BUN/Creatinine Ratio: 25 — ABNORMAL HIGH (ref 9–23)
BUN: 17 mg/dL (ref 6–24)
CO2: 23 mmol/L (ref 20–29)
Calcium: 9.5 mg/dL (ref 8.7–10.2)
Chloride: 103 mmol/L (ref 96–106)
Creatinine, Ser: 0.67 mg/dL (ref 0.57–1.00)
Glucose: 95 mg/dL (ref 70–99)
Potassium: 4.6 mmol/L (ref 3.5–5.2)
Sodium: 140 mmol/L (ref 134–144)
eGFR: 101 mL/min/{1.73_m2} (ref >59)

## 2023-09-05 LAB — LIPOPROTEIN A (LPA): Lipoprotein (a): 35 nmol/L (ref <75.0)

## 2023-09-21 ENCOUNTER — Encounter (HOSPITAL_COMMUNITY): Payer: Self-pay

## 2023-09-24 ENCOUNTER — Encounter (HOSPITAL_COMMUNITY): Payer: Self-pay

## 2023-09-24 ENCOUNTER — Ambulatory Visit (HOSPITAL_COMMUNITY)
Admission: RE | Admit: 2023-09-24 | Discharge: 2023-09-24 | Disposition: A | Discharge status: Home / Self Care | Payer: 59 | Source: Ambulatory Visit | Attending: Cardiology | Admitting: Cardiology

## 2023-09-24 DIAGNOSIS — E785 Hyperlipidemia, unspecified: Secondary | ICD-10-CM | POA: Diagnosis present

## 2023-09-24 DIAGNOSIS — R931 Abnormal findings on diagnostic imaging of heart and coronary circulation: Secondary | ICD-10-CM | POA: Diagnosis present

## 2023-09-24 MED ORDER — METOPROLOL TARTRATE 5 MG/5ML IV SOLN
INTRAVENOUS | Status: AC
  Filled 2023-09-24: qty 10

## 2023-09-24 MED ORDER — DILTIAZEM HCL 25 MG/5ML IV SOLN
5.0000 mg | Freq: Once | INTRAVENOUS | Status: AC
  Administered 2023-09-24: 5 mg via INTRAVENOUS

## 2023-09-24 MED ORDER — METOPROLOL TARTRATE 5 MG/5ML IV SOLN
5.0000 mg | Freq: Once | INTRAVENOUS | Status: AC
  Administered 2023-09-24: 5 mg via INTRAVENOUS

## 2023-09-24 MED ORDER — DILTIAZEM HCL 25 MG/5ML IV SOLN
INTRAVENOUS | Status: AC
  Filled 2023-09-24: qty 5

## 2023-09-24 MED ORDER — NITROGLYCERIN 0.4 MG SL SUBL
SUBLINGUAL_TABLET | SUBLINGUAL | Status: AC
  Filled 2023-09-24: qty 2

## 2023-09-24 MED ORDER — NITROGLYCERIN 0.4 MG SL SUBL
0.8000 mg | SUBLINGUAL_TABLET | Freq: Once | SUBLINGUAL | Status: AC
Start: 2023-09-24 — End: 2023-09-24
  Administered 2023-09-24: 0.8 mg via SUBLINGUAL

## 2023-09-24 NOTE — Progress Notes (Signed)
Patient ID: Renee Black, female   DOB: 1965-12-24, 57 y.o.   MRN: [id number] per Dr.Turner Cardilogist CT heart cancelled . Heart rate elevated dispite 10 mg  metoprolol 5 mg cardizem. Blood pressure drop. Pt. Discharged home vss. Verbalizes understanding will reschedule ct heart.

## 2023-10-05 ENCOUNTER — Telehealth (HOSPITAL_COMMUNITY): Payer: Self-pay | Admitting: Emergency Medicine

## 2023-10-05 MED ORDER — METOPROLOL TARTRATE 100 MG PO TABS: 100.0000 mg | ORAL_TABLET | Freq: Once | ORAL | 0 refills | Status: DC

## 2023-10-05 NOTE — Telephone Encounter (Signed)
Reordered 100mg  metoprolol for ccta [place] RN Navigator Cardiac Imaging [facility] [phone] Office  [phone] Cell

## 2023-10-07 ENCOUNTER — Telehealth: Payer: Self-pay | Admitting: Family Medicine

## 2023-10-07 NOTE — Telephone Encounter (Signed)
She can certainly try and do the Epley Maneuver at home- this is the handout that I use: https://dizziness-and-balance.com/BPPV/epley.pdf  I don't have any availability in office today and I hesitate to bring people in to do this b/c it can trigger severe dizziness.  If she ever goes anywhere to have this done, she would need someone to drive her.  If it's still bothering her after the holidays, she should let us know and I can refer her to vestibular rehab or neurology

## 2023-10-07 NOTE — Telephone Encounter (Signed)
I see that the Epley Maneuver is something that can be performed at home. Would you like me to send the patient the information of how to do it? How would you like to proceed?

## 2023-10-07 NOTE — Telephone Encounter (Signed)
Caller name: Jaslin Zaro  On DPR?: Yes  Call back number: [phone] (mobile)  Provider they see: Sheliah Hatch, MD  Reason for call:  Pt was seen for vertigo on 05/29/23 and she is still having issues with it. She was asking if our office could do the Epley maneuver and if we don't could she get a referral. She stated we could reach back out to her either by phone or MyChart message.

## 2023-10-07 NOTE — Telephone Encounter (Signed)
Called pt. unable to leave vm

## 2023-10-09 ENCOUNTER — Ambulatory Visit (HOSPITAL_COMMUNITY): Payer: 59

## 2023-10-22 ENCOUNTER — Telehealth (HOSPITAL_COMMUNITY): Payer: Self-pay | Admitting: *Deleted

## 2023-10-22 ENCOUNTER — Other Ambulatory Visit (HOSPITAL_COMMUNITY): Payer: Self-pay

## 2023-10-22 MED ORDER — IVABRADINE HCL 5 MG PO TABS
ORAL_TABLET | ORAL | 0 refills | Status: DC
  Filled 2023-10-22: qty 2, 1d supply, fill #0

## 2023-10-22 NOTE — Telephone Encounter (Signed)
Reaching out to patient to offer assistance regarding upcoming cardiac imaging study; pt verbalizes understanding of appt date/time, parking situation and where to check in, pre-test NPO status and medications ordered, and verified current allergies; name and call back number provided for further questions should they arise  Larey Brick RN Navigator Cardiac Imaging [facility] [phone] office [phone] cell  Patient to take 100mg  metoprolol tartrate and 10mg  ivabradine two hours prior to cardiac CT scan. She is aware to arrive at 2:30 PM.

## 2023-10-23 ENCOUNTER — Ambulatory Visit (HOSPITAL_COMMUNITY)
Admission: RE | Admit: 2023-10-23 | Discharge: 2023-10-23 | Disposition: A | Discharge status: Home / Self Care | Payer: 59 | Source: Ambulatory Visit | Attending: Cardiology | Admitting: Cardiology

## 2023-10-23 DIAGNOSIS — R931 Abnormal findings on diagnostic imaging of heart and coronary circulation: Secondary | ICD-10-CM | POA: Insufficient documentation

## 2023-10-23 DIAGNOSIS — I2510 Atherosclerotic heart disease of native coronary artery without angina pectoris: Secondary | ICD-10-CM

## 2023-10-23 DIAGNOSIS — E785 Hyperlipidemia, unspecified: Secondary | ICD-10-CM | POA: Insufficient documentation

## 2023-10-23 MED ORDER — IOHEXOL 350 MG/ML SOLN
95.0000 mL | Freq: Once | INTRAVENOUS | Status: AC | PRN
  Administered 2023-10-23: 95 mL via INTRAVENOUS

## 2023-10-23 MED ORDER — NITROGLYCERIN 0.4 MG SL SUBL
0.8000 mg | SUBLINGUAL_TABLET | Freq: Once | SUBLINGUAL | Status: AC
  Administered 2023-10-23: 0.8 mg via SUBLINGUAL

## 2023-10-23 MED ORDER — NITROGLYCERIN 0.4 MG SL SUBL
SUBLINGUAL_TABLET | SUBLINGUAL | Status: AC
  Filled 2023-10-23: qty 2

## 2023-11-03 ENCOUNTER — Encounter: Payer: Self-pay | Admitting: Cardiology

## 2023-11-05 ENCOUNTER — Other Ambulatory Visit (HOSPITAL_COMMUNITY): Payer: Self-pay

## 2023-11-05 MED ORDER — ATORVASTATIN CALCIUM 40 MG PO TABS
40.0000 mg | ORAL_TABLET | Freq: Every day | ORAL | 1 refills | Status: DC
  Filled 2023-11-05: qty 90, 90d supply, fill #0

## 2023-11-05 MED ORDER — ATORVASTATIN CALCIUM 40 MG PO TABS: 40.0000 mg | ORAL_TABLET | Freq: Every day | ORAL | 1 refills | Status: DC

## 2023-11-16 ENCOUNTER — Other Ambulatory Visit: Payer: Self-pay

## 2023-11-16 MED ORDER — ATORVASTATIN CALCIUM 40 MG PO TABS: 40.0000 mg | ORAL_TABLET | Freq: Every day | ORAL | 2 refills | Status: DC

## 2023-12-10 ENCOUNTER — Other Ambulatory Visit: Payer: Self-pay | Admitting: Family Medicine

## 2023-12-10 IMAGING — MG DIGITAL SCREENING BREAST BILAT IMPLANT W/ TOMO W/ CAD
8 of 12 series · 8 of 28 positions shown · non-contrast
Comparison: Previous exam(s).

CLINICAL DATA: Screening.

EXAM:
DIGITAL SCREENING BILATERAL MAMMOGRAM WITH IMPLANTS, CAD AND
TOMOSYNTHESIS
TECHNIQUE: Bilateral screening digital craniocaudal and mediolateral oblique
mammograms were obtained. Bilateral screening digital breast
tomosynthesis was performed. The images were evaluated with
computer-aided detection. Standard and/or implant displaced views
were performed.

[R MLO]
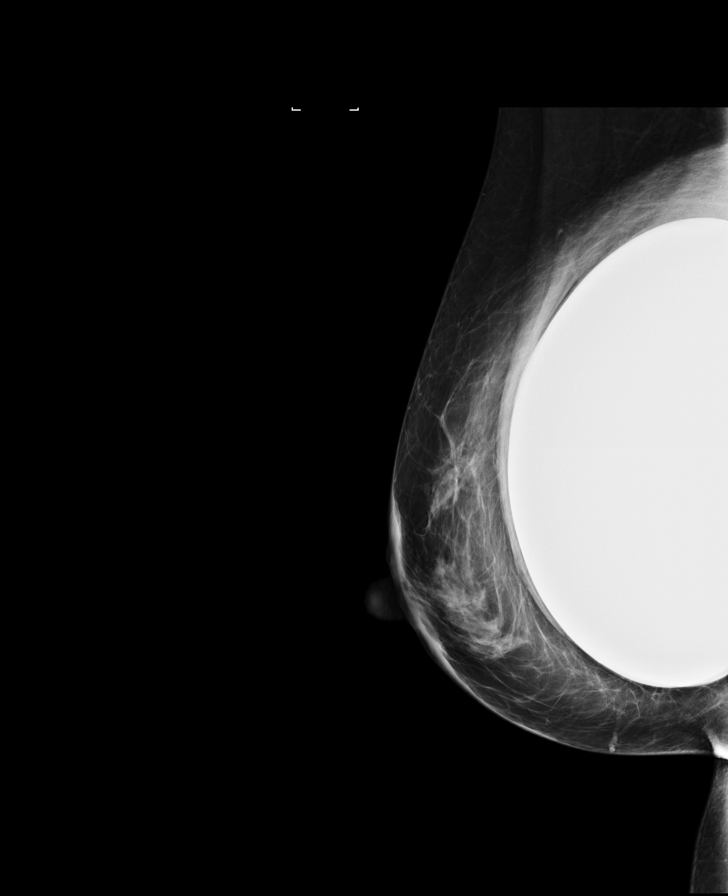

[L CC]
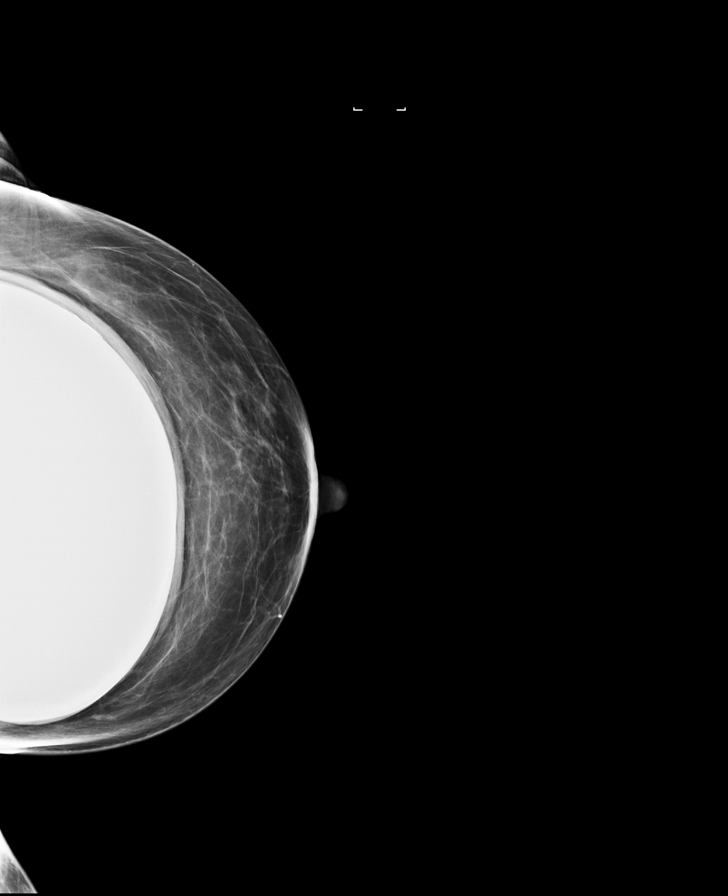

[R CC]
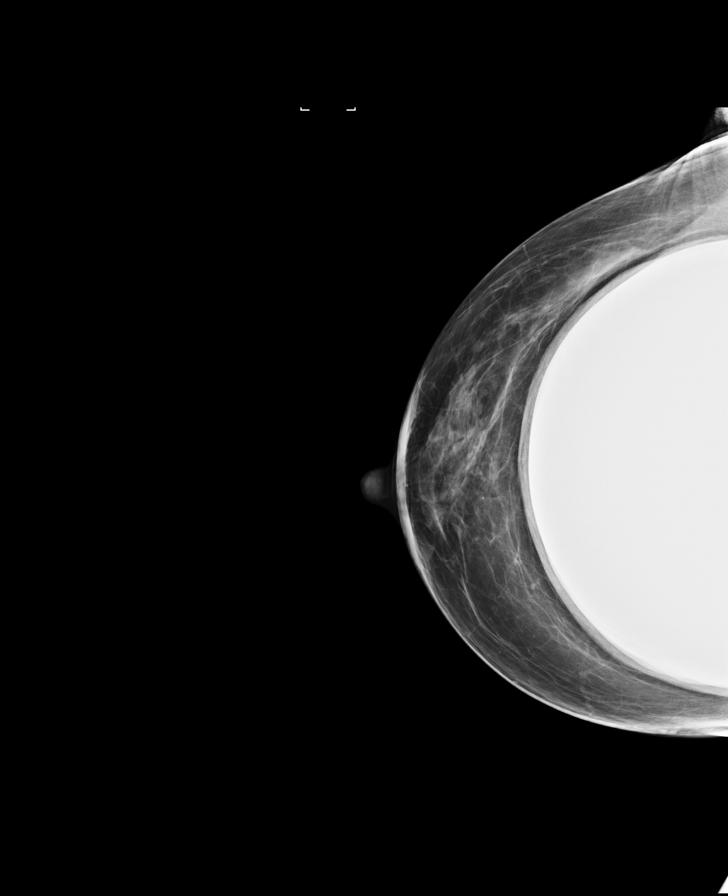

[L MLO]
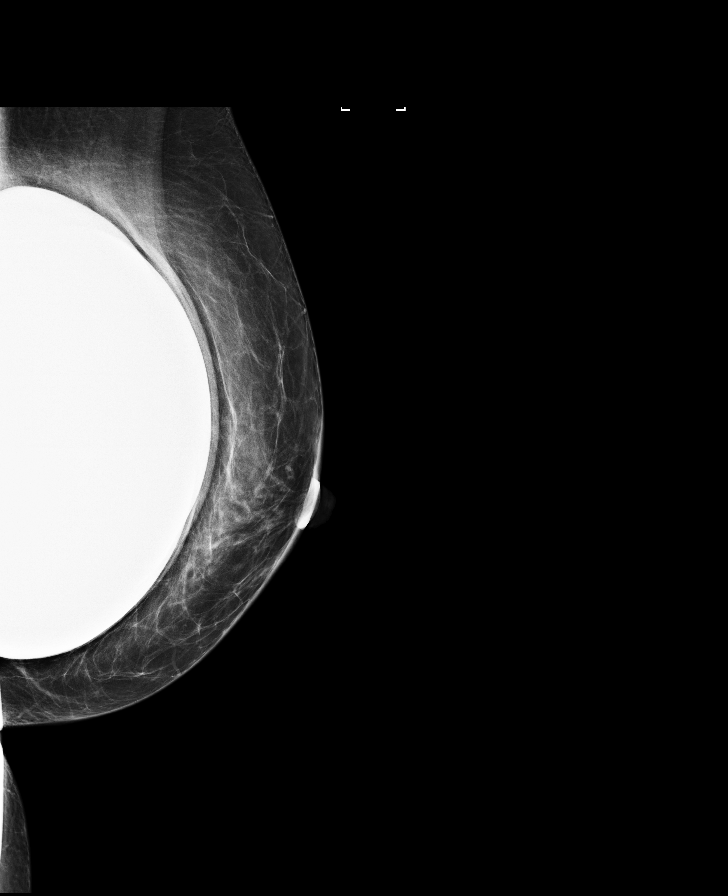

[L CC synth-2D]
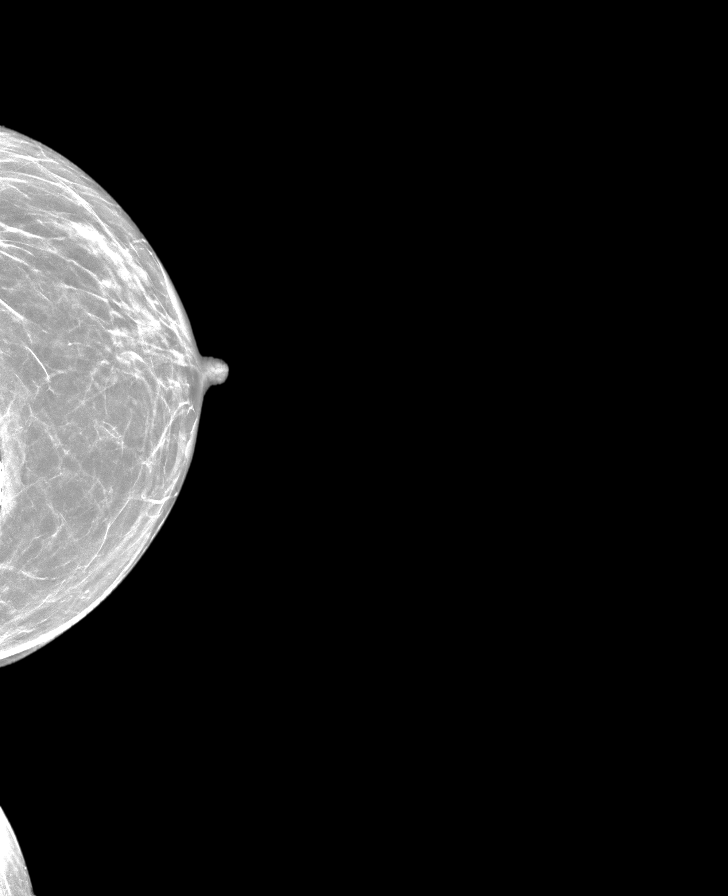

[R MLO synth-2D]
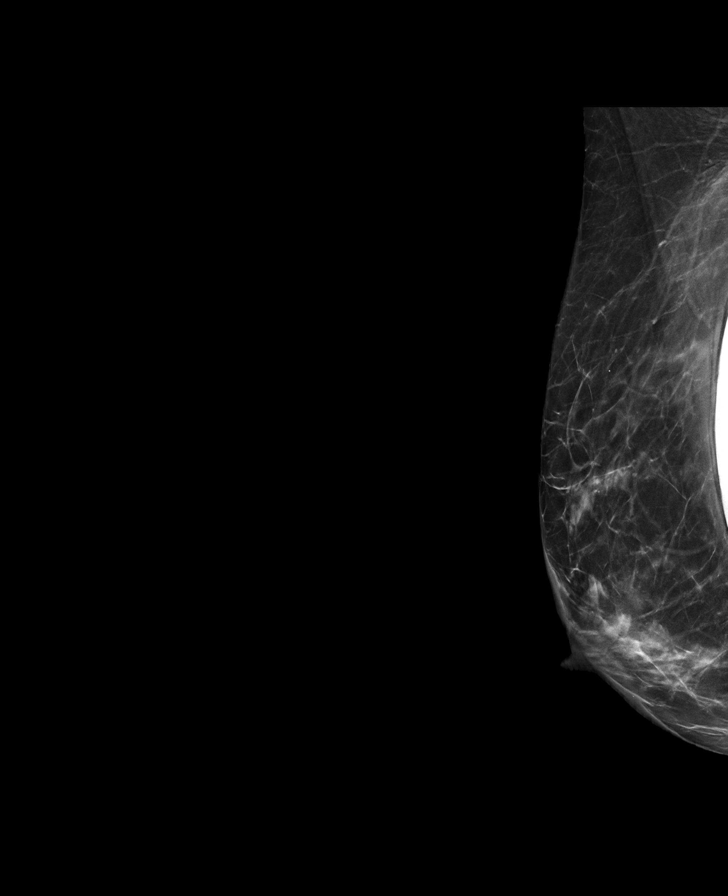

[R CC synth-2D]
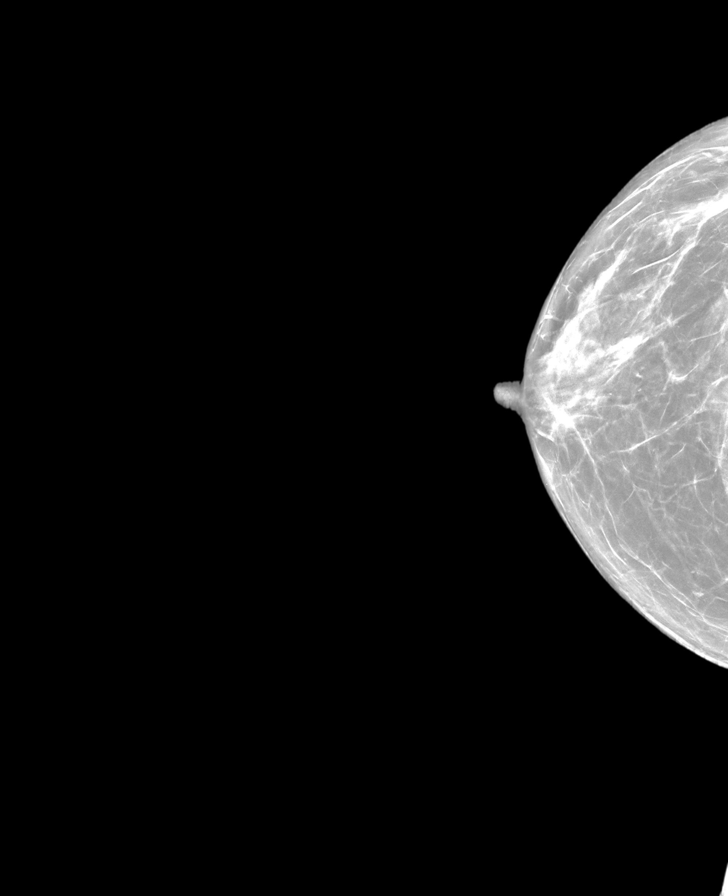

[L MLO synth-2D]
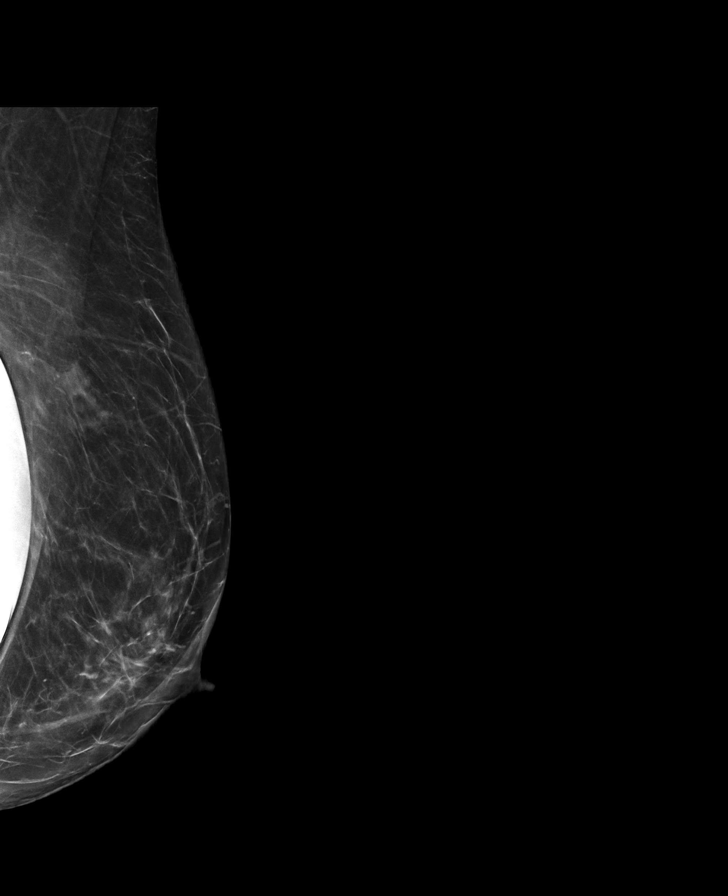

[8 of 28 positions shown; findings below may reference images not displayed]

ACR Breast Density Category b: There are scattered areas of
fibroglandular density.
FINDINGS: The patient has implants. There are no findings suspicious for
malignancy.
IMPRESSION: No mammographic evidence of malignancy. A result letter of this
screening mammogram will be mailed directly to the patient.

RECOMMENDATION:
Screening mammogram in one year. (Code:[id number])

BI-RADS CATEGORY  1:  Negative.

## 2024-01-08 ENCOUNTER — Ambulatory Visit (INDEPENDENT_AMBULATORY_CARE_PROVIDER_SITE_OTHER): Payer: 59 | Admitting: Family Medicine

## 2024-01-08 ENCOUNTER — Encounter: Payer: Self-pay | Admitting: Family Medicine

## 2024-01-08 VITALS — BP 120/72 | HR 103 | Temp 98.6°F | Wt 152.0 lb

## 2024-01-08 DIAGNOSIS — H8110 Benign paroxysmal vertigo, unspecified ear: Secondary | ICD-10-CM

## 2024-01-08 MED ORDER — MECLIZINE HCL 25 MG PO TABS: 25.0000 mg | ORAL_TABLET | Freq: Three times a day (TID) | ORAL | 0 refills | Status: AC | PRN

## 2024-01-08 MED ORDER — PREDNISONE 10 MG PO TABS: ORAL_TABLET | ORAL | 0 refills | Status: DC

## 2024-01-08 MED ORDER — METHOCARBAMOL 500 MG PO TABS: 500.0000 mg | ORAL_TABLET | Freq: Three times a day (TID) | ORAL | 0 refills | Status: DC | PRN

## 2024-01-08 NOTE — Progress Notes (Signed)
   Subjective:    Patient ID: Renee Black, female    DOB: 1966/10/10, 58 y.o.   MRN: [id number]  HPI Dizziness- pt reports she began having 'little bouts' of episodic vertigo starting 2 weeks ago.  Sxs are most prominent when rolling over in bed, leaning her head back in the shower.  Also has a general sense of light headedness.  No fever.  Some mild nasal congestion.  No ear pain.  Pt reports she is under considerable stress.  Does have neck stiffness.  Sxs are unchanged from onset.  Some improvement w/ meclizine.  Did try the [facility] but no relief.     Review of Systems For ROS see HPI     Objective:   Physical Exam Vitals reviewed.  Constitutional:      General: She is not in acute distress.    Appearance: Normal appearance. She is well-developed. She is not ill-appearing.  HENT:     Head: Normocephalic and atraumatic.     Right Ear: Tympanic membrane and ear canal normal.     Left Ear: Tympanic membrane and ear canal normal.     Nose: No congestion.     Right Sinus: No maxillary sinus tenderness or frontal sinus tenderness.     Left Sinus: No maxillary sinus tenderness or frontal sinus tenderness.     Mouth/Throat:     Pharynx: Uvula midline.  Eyes:     Conjunctiva/sclera: Conjunctivae normal.     Pupils: Pupils are equal, round, and reactive to light.  Cardiovascular:     Rate and Rhythm: Normal rate and regular rhythm.     Heart sounds: Normal heart sounds.  Pulmonary:     Effort: Pulmonary effort is normal. No respiratory distress.     Breath sounds: Normal breath sounds. No wheezing or rales.  Musculoskeletal:     Cervical back: Normal range of motion and neck supple.  Lymphadenopathy:     Cervical: No cervical adenopathy.  Skin:    General: Skin is warm and dry.  Neurological:     General: No focal deficit present.     Mental Status: She is alert and oriented to person, place, and time.     Cranial Nerves: No cranial nerve deficit.     Gait: Gait  normal.     Deep Tendon Reflexes: Reflexes are normal and symmetric.  Psychiatric:        Behavior: Behavior normal.        Thought Content: Thought content normal.        Judgment: Judgment normal.           Assessment & Plan:  BPV- recurrent.  Pt has hx of similar.  Sxs returned ~2 weeks ago in episodic bouts.  She admits to high stress levels and increased tension in back and neck.  No obvious abnormalities on PE.  Sxs improve w/ Meclizine.  Start Prednisone taper in case of labyrinthitis.  Meclizine PRN.  Methocarbamol as needed for back and neck spasm.  Pt expressed understanding and is in agreement w/ plan.

## 2024-01-08 NOTE — Patient Instructions (Signed)
 Follow up as needed or as scheduled START the Prednisone as directed- 3 pills at the same time x3 days, then 2 pills at the same time x3 days, then 1 pill daily.  Take w/ food  USE the Methocarbamol (muscle relaxer) as needed for neck and back tightness Take the Meclizine as needed for dizziness Try the Eppley Maneuver- both sides- to help w/ the vertigo Drink LOTS of water Call with any questions or concerns Stay Safe!  Stay Healthy! Hang in there!

## 2024-01-15 ENCOUNTER — Encounter: Payer: 59 | Admitting: Family Medicine

## 2024-01-28 ENCOUNTER — Other Ambulatory Visit: Payer: Self-pay | Admitting: Family Medicine

## 2024-01-28 DIAGNOSIS — Z1231 Encounter for screening mammogram for malignant neoplasm of breast: Secondary | ICD-10-CM

## 2024-02-03 ENCOUNTER — Encounter: Admitting: Family Medicine

## 2024-02-12 ENCOUNTER — Ambulatory Visit
Admission: RE | Admit: 2024-02-12 | Discharge: 2024-02-12 | Disposition: A | Discharge status: Home / Self Care | Source: Ambulatory Visit | Attending: Family Medicine | Admitting: Family Medicine

## 2024-02-12 DIAGNOSIS — Z1231 Encounter for screening mammogram for malignant neoplasm of breast: Secondary | ICD-10-CM

## 2024-02-15 ENCOUNTER — Ambulatory Visit (INDEPENDENT_AMBULATORY_CARE_PROVIDER_SITE_OTHER): Admitting: Family Medicine

## 2024-02-15 ENCOUNTER — Encounter: Payer: Self-pay | Admitting: Family Medicine

## 2024-02-15 VITALS — BP 112/62 | HR 89 | Temp 98.1°F | Ht 67.0 in | Wt 155.0 lb

## 2024-02-15 DIAGNOSIS — Z0000 Encounter for general adult medical examination without abnormal findings: Secondary | ICD-10-CM | POA: Diagnosis not present

## 2024-02-15 DIAGNOSIS — Z23 Encounter for immunization: Secondary | ICD-10-CM | POA: Diagnosis not present

## 2024-02-15 DIAGNOSIS — E782 Mixed hyperlipidemia: Secondary | ICD-10-CM

## 2024-02-15 LAB — CBC WITH DIFFERENTIAL/PLATELET
Basophils Absolute: 0 10*3/uL (ref 0.0–0.1)
Basophils Relative: 0.4 % (ref 0.0–3.0)
Eosinophils Absolute: 0.1 10*3/uL (ref 0.0–0.7)
Eosinophils Relative: 0.7 % (ref 0.0–5.0)
HCT: 38.8 % (ref 36.0–46.0)
Hemoglobin: 13.1 g/dL (ref 12.0–15.0)
Lymphocytes Relative: 21.6 % (ref 12.0–46.0)
Lymphs Abs: 1.7 10*3/uL (ref 0.7–4.0)
MCHC: 33.8 g/dL (ref 30.0–36.0)
MCV: 94.3 fl (ref 78.0–100.0)
Monocytes Absolute: 0.6 10*3/uL (ref 0.1–1.0)
Monocytes Relative: 7.2 % (ref 3.0–12.0)
Neutro Abs: 5.4 10*3/uL (ref 1.4–7.7)
Neutrophils Relative %: 70.1 % (ref 43.0–77.0)
Platelets: 266 10*3/uL (ref 150.0–400.0)
RBC: 4.11 Mil/uL (ref 3.87–5.11)
RDW: 14.3 % (ref 11.5–15.5)
WBC: 7.7 10*3/uL (ref 4.0–10.5)

## 2024-02-15 LAB — BASIC METABOLIC PANEL WITH GFR
BUN: 15 mg/dL (ref 6–23)
CO2: 27 meq/L (ref 19–32)
Calcium: 9.2 mg/dL (ref 8.4–10.5)
Chloride: 102 meq/L (ref 96–112)
Creatinine, Ser: 0.73 mg/dL (ref 0.40–1.20)
GFR: 90.37 mL/min (ref >60.00)
Glucose, Bld: 127 mg/dL — ABNORMAL HIGH (ref 70–99)
Potassium: 4.1 meq/L (ref 3.5–5.1)
Sodium: 137 meq/L (ref 135–145)

## 2024-02-15 LAB — TSH: TSH: 0.71 u[IU]/mL (ref 0.35–5.50)

## 2024-02-15 LAB — LIPID PANEL
Cholesterol: 164 mg/dL (ref 0–200)
HDL: 85.8 mg/dL (ref >39.00)
LDL Cholesterol: 54 mg/dL (ref 0–99)
NonHDL: 78.66
Total CHOL/HDL Ratio: 2
Triglycerides: 124 mg/dL (ref 0.0–149.0)
VLDL: 24.8 mg/dL (ref 0.0–40.0)

## 2024-02-15 LAB — HEPATIC FUNCTION PANEL
ALT: 15 U/L (ref 0–35)
AST: 19 U/L (ref 0–37)
Albumin: 4.2 g/dL (ref 3.5–5.2)
Alkaline Phosphatase: 51 U/L (ref 39–117)
Bilirubin, Direct: 0.1 mg/dL (ref 0.0–0.3)
Total Bilirubin: 0.5 mg/dL (ref 0.2–1.2)
Total Protein: 6.7 g/dL (ref 6.0–8.3)

## 2024-02-15 NOTE — Progress Notes (Signed)
   Subjective:    Patient ID: Renee Black, female    DOB: [birth year], 58 y.o.   MRN: [id number]  HPI CPE- UTD on mammo, pap, colonoscopy, Tdap  Patient Care Team    Relationship Specialty Notifications Start End  Sheliah Hatch, MD PCP - General Family Medicine  12/02/17   Jake Bathe, MD PCP - Cardiology Cardiology  09/01/23   Vida Rigger, MD Consulting Physician Gastroenterology  12/02/17   Maxie Better, MD Consulting Physician Obstetrics and Gynecology  12/27/19     Health Maintenance  Topic Date Due   Pneumococcal Vaccine 15-15 Years old (1 of 2 - PCV) Never done   INFLUENZA VACCINE  06/10/2024   MAMMOGRAM  12/01/2024   Colonoscopy  10/30/2026   Cervical Cancer Screening (HPV/Pap Cotest)  07/15/2028   DTaP/Tdap/Td (2 - Td or Tdap) 12/22/2028   Hepatitis C Screening  Completed   HIV Screening  Completed   Zoster Vaccines- Shingrix  Completed   HPV VACCINES  Aged Out   COVID-19 Vaccine  Discontinued      Review of Systems Patient reports no vision/ hearing changes, adenopathy,fever, weight change,  persistant/recurrent hoarseness , swallowing issues, chest pain, palpitations, edema, persistant/recurrent cough, hemoptysis, dyspnea (rest/exertional/paroxysmal nocturnal), gastrointestinal bleeding (melena, rectal bleeding), abdominal pain, significant heartburn, bowel changes, GU symptoms (dysuria, hematuria, incontinence), Gyn symptoms (abnormal  bleeding, pain),  syncope, focal weakness, memory loss, numbness & tingling, skin/hair/nail changes, abnormal bruising or bleeding, anxiety, or depression.     Objective:   Physical Exam General Appearance:    Alert, cooperative, no distress, appears stated age  Head:    Normocephalic, without obvious abnormality, atraumatic  Eyes:    PERRL, conjunctiva/corneas clear, EOM's intact both eyes  Ears:    Normal TM's and external ear canals, both ears  Nose:   Nares normal, septum midline, mucosa normal, no drainage    or  sinus tenderness  Throat:   Lips, mucosa, and tongue normal; teeth and gums normal  Neck:   Supple, symmetrical, trachea midline, no adenopathy;    Thyroid: no enlargement/tenderness/nodules  Back:     Symmetric, no curvature, ROM normal, no CVA tenderness  Lungs:     Clear to auscultation bilaterally, respirations unlabored  Chest Wall:    No tenderness or deformity   Heart:    Regular rate and rhythm, S1 and S2 normal, no murmur, rub   or gallop  Breast Exam:    Deferred to GYN  Abdomen:     Soft, non-tender, bowel sounds active all four quadrants,    no masses, no organomegaly  Genitalia:    Deferred to GYN  Rectal:    Extremities:   Extremities normal, atraumatic, no cyanosis or edema  Pulses:   2+ and symmetric all extremities  Skin:   Skin color, texture, turgor normal, no rashes or lesions  Lymph nodes:   Cervical, supraclavicular, and axillary nodes normal  Neurologic:   CNII-XII intact, normal strength, sensation and reflexes    throughout          Assessment & Plan:

## 2024-02-15 NOTE — Assessment & Plan Note (Signed)
 Pt's PE WNL.  UTD on pap, mammo, colonoscopy, Tdap.  Prevnar given today.  Check labs.  Anticipatory guidance provided.

## 2024-02-15 NOTE — Patient Instructions (Signed)
Follow up in 1 year or as needed We'll notify you of your lab results and make any changes if needed Keep up the good work on healthy diet and regular exercise- you look great! Call with any questions or concerns Stay Safe!  Stay Healthy! ENJOY YOUR TRIP!!!

## 2024-02-16 ENCOUNTER — Telehealth: Payer: Self-pay

## 2024-02-16 ENCOUNTER — Encounter: Payer: Self-pay | Admitting: Family Medicine

## 2024-02-16 NOTE — Telephone Encounter (Signed)
 Pt has reviewed labs via MyChart

## 2024-02-16 NOTE — Telephone Encounter (Signed)
-----   Message from Neena Rhymes sent at 02/16/2024 12:16 PM EDT ----- Labs look great!  Your sugar is elevated but I know you were not fasting at the time of labs- so this is nothing to worry about!

## 2024-04-19 ENCOUNTER — Encounter: Payer: Self-pay | Admitting: Family Medicine

## 2024-04-19 NOTE — Telephone Encounter (Signed)
 Patient was using Premarin Vaginal cream but she states she did not use it often and it expired so she threw it away. Now patient is requesting a new prescription for this. Looking in patients chart I did see the medication under the medication history tab, it was last filled 07/22/2019 but it does not look like it has been prescribed by you before.

## 2024-04-20 MED ORDER — PREMARIN 0.625 MG/GM VA CREA: 0.5000 g | TOPICAL_CREAM | VAGINAL | 3 refills | Status: DC

## 2024-06-08 ENCOUNTER — Other Ambulatory Visit: Payer: Self-pay | Admitting: Cardiology

## 2024-06-09 ENCOUNTER — Encounter: Payer: Self-pay | Admitting: Student in an Organized Health Care Education/Training Program

## 2024-06-09 ENCOUNTER — Ambulatory Visit (INDEPENDENT_AMBULATORY_CARE_PROVIDER_SITE_OTHER): Admitting: Student in an Organized Health Care Education/Training Program

## 2024-06-09 VITALS — BP 134/74 | HR 80 | Wt 149.0 lb

## 2024-06-09 DIAGNOSIS — H8111 Benign paroxysmal vertigo, right ear: Secondary | ICD-10-CM | POA: Diagnosis not present

## 2024-06-09 NOTE — Progress Notes (Signed)
   Acute Office Visit  Subjective:     Patient ID: Renee Black, female    DOB: Nov 09, 1966, 58 y.o.   MRN: [id number]  Chief Complaint  Patient presents with   Dizziness    Patient states she had vertigo last July and did receive a prescription for the dizziness that comes and goes. Rolling over in bed, getting out of bed,showering, gym. Wanted to see a chiropractor but wanted an opinion from a  provider first.     HPI  58 year old person here for acute visit for paroxysmal vertigo.  This has been an issue for her in the past.  Present for the last couple weeks.  She notices lightheadedness and dizziness when she positions her head to put in eyedrops, or washes her hair, or sits up quickly from a lying position at the gym.  Symptoms lasted about 30 seconds and then resolved.  She has had no headache, no changes in her hearing.  Denies any weakness or imbalance.  Symptoms have not been functional limiting.  She has had no nausea or vomiting.  She took a couple doses of meclizine  and found some benefit.  Denied any sedation from that.  No recent headaches.  No other changes in her health.  No recent changes in her medications.      Objective:    BP 134/74   Pulse 80   Wt 149 lb (67.6 kg)   BMI 23.34 kg/m    Physical Exam  Gen: Well-appearing woman Neuro: Alert, conversational, full strength in the upper and lower extremities, diminished patellar and brachial reflexes, no truncal or gait ataxia, normal sensation throughout, normal cranial nerves, no nystagmus with extraocular motion. Heart: Regular, no murmur Ext: Warm, no edema, normal joints      Assessment & Plan:   Problem List Items Addressed This Visit       Unprioritized   BPPV (benign paroxysmal positional vertigo), right - Primary   Acute issue.  Has been a recurrent problem for her over the last few years.  This episode lasting a few weeks.  History consistent with BPPV, most likely affecting the right ear.   Neuroexam is normal.  Very low risk for central vertigo.  I recommended Epley maneuver and gave her instructions.  Thankfully her symptoms are not currently functional limiting.  I recommended very cautious use of meclizine  given its side effects of sedation.  We talked about a referral to physical therapy for vestibular rehab, and more training on the positioning maneuvers that I think would help her in the long-term.      Relevant Orders   Ambulatory referral to Physical Therapy    Return if symptoms worsen or fail to improve.  Cleatus Debby Specking, MD

## 2024-06-09 NOTE — Assessment & Plan Note (Signed)
 Acute issue.  Has been a recurrent problem for her over the last few years.  This episode lasting a few weeks.  History consistent with BPPV, most likely affecting the right ear.  Neuroexam is normal.  Very low risk for central vertigo.  I recommended Epley maneuver and gave her instructions.  Thankfully her symptoms are not currently functional limiting.  I recommended very cautious use of meclizine  given its side effects of sedation.  We talked about a referral to physical therapy for vestibular rehab, and more training on the positioning maneuvers that I think would help her in the long-term.

## 2024-06-14 ENCOUNTER — Other Ambulatory Visit: Payer: Self-pay

## 2024-06-14 ENCOUNTER — Ambulatory Visit: Attending: Student in an Organized Health Care Education/Training Program

## 2024-06-14 DIAGNOSIS — R42 Dizziness and giddiness: Secondary | ICD-10-CM | POA: Insufficient documentation

## 2024-06-14 DIAGNOSIS — H8111 Benign paroxysmal vertigo, right ear: Secondary | ICD-10-CM | POA: Insufficient documentation

## 2024-06-14 NOTE — Therapy (Signed)
 OUTPATIENT PHYSICAL THERAPY VESTIBULAR EVALUATION     Patient Name: Renee Black MRN: [id number] DOB:1966-09-02, 58 y.o., female Today's Date: 06/14/2024  END OF SESSION:  PT End of Session - 06/14/24 1016     Visit Number 1    Number of Visits 4    Date for PT Re-Evaluation 07/26/24    Authorization Type [employer]    PT Start Time 1015    PT Stop Time 1100    PT Time Calculation (min) 45 min          History reviewed. No pertinent past medical history. Past Surgical History:  Procedure Laterality Date   AUGMENTATION MAMMAPLASTY Bilateral 2005   ROTATOR CUFF REPAIR     Patient Active Problem List   Diagnosis Date Noted   BPPV (benign paroxysmal positional vertigo), right 06/09/2024   Chronic vaginitis 10/08/2021   Low libido 10/08/2021   Menopause 10/08/2021   Pain in female genitalia on intercourse 10/08/2021   Subclinical hypothyroidism 10/08/2021   Vaginal dryness 10/08/2021   Low testosterone 11/23/2020   Polyp of cervix 11/23/2020   Vulvodynia 11/23/2020   Drug-induced constipation 06/29/2020   Family history of early CAD 09/07/2019   HSV-1 (herpes simplex virus 1) infection 06/25/2018   Physical exam 12/02/2017   Hyperlipidemia 12/02/2017    PCP: Mahlon Comer BRAVO, MD REFERRING PROVIDER: Jerrell Cleatus Ned, MD  REFERRING DIAG:  H81.11 (ICD-10-CM) - BPPV (benign paroxysmal positional vertigo), right    THERAPY DIAG:  Dizziness and giddiness  ONSET DATE: 1.5 years  Rationale for Evaluation and Treatment: Rehabilitation  SUBJECTIVE:   SUBJECTIVE STATEMENT: Issues of positional vertigo over past 1.5 year.  Has been performing self-Epley at home to good effect.  Notes positions provoking are rolling towards the right and tipping head back for eye drops or washing hair.  Episodes last about 10-60. Denies any otalgia, no photo/phonophobia. Has hx of neck pain,but nothing lasting.  Reports she performed Epley maneuver over the weekend  but seems to be about the same.  Pt accompanied by: self  PERTINENT HISTORY: hx of BPPV  PAIN:  Are you having pain? No  PRECAUTIONS: None  RED FLAGS: None   WEIGHT BEARING RESTRICTIONS: No  FALLS: Has patient fallen in last 6 months? No    PLOF: Independent  PATIENT GOALS: eliminate dizziness  OBJECTIVE:  Note: Objective measures were completed at Evaluation unless otherwise noted.    POSTURE:  No Significant postural limitations  Cervical ROM:    Active A/PROM (deg) eval  Flexion WNL  Extension WNL  Right lateral flexion WNl  Left lateral flexion WNL  Right rotation WNL  Left rotation WNL  (Blank rows = not tested)  STRENGTH:   LOWER EXTREMITY MMT:   MMT Right eval Left eval  Hip flexion    Hip abduction    Hip adduction    Hip internal rotation    Hip external rotation    Knee flexion    Knee extension    Ankle dorsiflexion    Ankle plantarflexion    Ankle inversion    Ankle eversion    (Blank rows = not tested)    GAIT: Gait pattern: WFL Distance walked:  Assistive device utilized: None Level of assistance: Complete Independence Comments:    PATIENT SURVEYS:  DHI 18/100  VESTIBULAR ASSESSMENT:  GENERAL OBSERVATION: wears glasses   SYMPTOM BEHAVIOR:  Subjective history: recurrent hx x 1.5 yr positional vertigo  Non-Vestibular symptoms: none  Type of dizziness: Imbalance (Disequilibrium) and Spinning/Vertigo  Frequency: varies, sporadic appearance  Duration: seconds-minute  Aggravating factors: Induced by position change: lying supine and rolling to the right, tipping head back  Relieving factors: Epley maneuver  Progression of symptoms: success w/ Epley maneuver, but recurring  OCULOMOTOR EXAM:  Ocular Alignment: normal  Ocular ROM: No Limitations  Spontaneous Nystagmus: absent  Gaze-Induced Nystagmus: absent  Smooth Pursuits: intact  Saccades: intact  Convergence/Divergence: 3 cm     VESTIBULAR - OCULAR REFLEX:    Slow VOR: Normal  VOR Cancellation: Normal  Head-Impulse Test: HIT Right: negative HIT Left: a few covert slips 2 of 5 reps  Dynamic Visual Acuity: Not able to be assessed   POSITIONAL TESTING: Right Dix-Hallpike: no nystagmus Left Dix-Hallpike: no nystagmus Right Roll Test: no nystagmus Left Roll Test: no nystagmus Right Sidelying: no nystagmus Left Sidelying: no nystagmus Notes motion sensitivity w/ change of positions during testing  MOTION SENSITIVITY:  Motion Sensitivity Quotient Intensity: 0 = none, 1 = Lightheaded, 2 = Mild, 3 = Moderate, 4 = Severe, 5 = Vomiting  Intensity  1. Sitting to supine   2. Supine to L side   3. Supine to R side   4. Supine to sitting   5. L Hallpike-Dix   6. Up from L    7. R Hallpike-Dix   8. Up from R    9. Sitting, head tipped to L knee   10. Head up from L knee   11. Sitting, head tipped to R knee   12. Head up from R knee   13. Sitting head turns x5   14.Sitting head nods x5   15. In stance, 180 turn to L    16. In stance, 180 turn to R     OTHOSTATICS: not done  FUNCTIONAL GAIT: FGA TBD   M-CTSIB  Condition 1: Firm Surface, EO 30 Sec, Normal Sway  Condition 2: Firm Surface, EC 30 Sec, Mild Sway  Condition 3: Foam Surface, EO 30 Sec, Normal Sway  Condition 4: Foam Surface, EC 30 Sec, Moderate Sway                                                                                                                              TREATMENT DATE: 06/14/24  See HEP below  PATIENT EDUCATION: Education details: assessment details, nature/physiology of BPPV, HEP initiation Person educated: Patient Education method: Explanation and Handouts Education comprehension: verbalized understanding  HOME EXERCISE PROGRAM: Access Code: [email or web address] URL: [email or web address] Date: 06/14/2024 Prepared by: Kelly Rivers Gassmann  Exercises - Seated Gaze Stabilization with Head Rotation  - 1 x daily - 7 x weekly - 3 sets - 15-30 sec  hold - Standing Gaze Stabilization with Head Nod  - 1 x daily - 7 x weekly - 3 sets - 15-30 sec hold - Walking Gaze Stabilization Head Rotation  - 1 x daily - 7 x weekly - 3 sets - 15-30 sec hold - Brandt-Daroff Vestibular Exercise  - 1 x  daily - 7 x weekly - 5 reps  GOALS: Goals reviewed with patient? Yes  SHORT TERM GOALS: Target date: same as LTG    LONG TERM GOALS: Target date: 07/26/2024    The patient will be independent with HEP for gaze adaptation, habituation, balance, and general mobility. Baseline:  Goal status: INITIAL  2.  Teach back methods for self-assessment of canalithiasis for self-mgmt Baseline:  Goal status: INITIAL  3.  Demo improved postural stability normal-mild sway x 30 sec condition 4 M-CTSIB for improved safety during ADL Baseline: moderate x 30 sec condition 4 Goal status: INITIAL  4.  DHI 0/100 for improved symptoms Baseline: 18/100 Goal status: INITIAL   ASSESSMENT:  CLINICAL IMPRESSION: Patient is a 58 y.o. lady who was seen today for physical therapy evaluation and treatment for dizziness/BPPV.  Notes a recurrent hx over past 1.5 year with successful use of Epley for self-treatment.  Oculomotor and VOR testing unrevealing. Head Impulse Test with subtle instances of corrective saccade 2/5 trials Left. Positional tests unrevealing for BPPV at this time but exhibits motion sensitivity with change of position from these movements.  M-CTSIB reveals moderate sway x 30 sec M-CTSIB implying decreased vestibular input to balance.    Initiated tx on this date with VOR in different positions/activities for habituation as well as brandt-daroff for motion sensitivity and for means of self-assessment.  Additional visits would be helpful to provide ongoing assessment and intervention   OBJECTIVE IMPAIRMENTS: decreased balance and dizziness.   ACTIVITY LIMITATIONS: bending, reach over head, and hygiene/grooming  PARTICIPATION LIMITATIONS: meal prep, cleaning,  and exercise  PERSONAL FACTORS: Time since onset of injury/illness/exacerbation are also affecting patient's functional outcome.   REHAB POTENTIAL: Excellent  CLINICAL DECISION MAKING: Stable/uncomplicated  EVALUATION COMPLEXITY: Low   PLAN:  PT FREQUENCY: 1x/week or every other depending on symptoms  PT DURATION: 6 weeks  PLANNED INTERVENTIONS: 97750- Physical Performance Testing, 97110-Therapeutic exercises, 97530- Therapeutic activity, [id number]- Neuromuscular re-education, 97535- Self Care, [id number]- Manual therapy, [id number]- Gait training, and [phone]- Canalith repositioning  PLAN FOR NEXT SESSION: re-assess for BPPV as indicated, HEP review and progressions for higher level vestibular/balance activities that she can incorporate with her exercise routine (enjoys strength training, e.g. balance+strength activities)   11:41 AM, 06/14/24 M. Kelly Faizan Geraci, PT, DPT Physical Therapist- [place] Office Number: [phone]

## 2024-06-23 ENCOUNTER — Ambulatory Visit: Admitting: Physical Therapy

## 2024-06-23 ENCOUNTER — Encounter: Payer: Self-pay | Admitting: Physical Therapy

## 2024-06-23 DIAGNOSIS — R42 Dizziness and giddiness: Secondary | ICD-10-CM

## 2024-06-23 NOTE — Therapy (Signed)
 OUTPATIENT PHYSICAL THERAPY TREATMENT   Patient Name: Renee Black MRN: [id number] DOB:07-07-1966, 58 y.o., female Today's Date: 06/23/2024  END OF SESSION:  PT End of Session - 06/23/24 1358     Visit Number 2    Number of Visits 4    Date for PT Re-Evaluation 07/26/24    Authorization Type [employer]    PT Start Time 1400    PT Stop Time 1440    PT Time Calculation (min) 40 min           History reviewed. No pertinent past medical history. Past Surgical History:  Procedure Laterality Date   AUGMENTATION MAMMAPLASTY Bilateral 2005   ROTATOR CUFF REPAIR     Patient Active Problem List   Diagnosis Date Noted   BPPV (benign paroxysmal positional vertigo), right 06/09/2024   Chronic vaginitis 10/08/2021   Low libido 10/08/2021   Menopause 10/08/2021   Pain in female genitalia on intercourse 10/08/2021   Subclinical hypothyroidism 10/08/2021   Vaginal dryness 10/08/2021   Low testosterone 11/23/2020   Polyp of cervix 11/23/2020   Vulvodynia 11/23/2020   Drug-induced constipation 06/29/2020   Family history of early CAD 09/07/2019   HSV-1 (herpes simplex virus 1) infection 06/25/2018   Physical exam 12/02/2017   Hyperlipidemia 12/02/2017    PCP: Mahlon Comer BRAVO, MD REFERRING PROVIDER: Jerrell Cleatus Ned, MD  REFERRING DIAG:  H81.11 (ICD-10-CM) - BPPV (benign paroxysmal positional vertigo), right    THERAPY DIAG:  Dizziness and giddiness  ONSET DATE: 1.5 years  Rationale for Evaluation and Treatment: Rehabilitation  SUBJECTIVE:   SUBJECTIVE STATEMENT: Pt reports she felt better with her vertigo. Felt normal for a couple of days. Monday, Tuesday and Wednesday she felt the sensations again. Has been able to do the exercises.   From eval: Issues of positional vertigo over past 1.5 year.  Has been performing self-Epley at home to good effect.  Notes positions provoking are rolling towards the right and tipping head back for eye drops or  washing hair.  Episodes last about 10-60. Denies any otalgia, no photo/phonophobia. Has hx of neck pain,but nothing lasting.  Reports she performed Epley maneuver over the weekend but seems to be about the same.  Pt accompanied by: self  PERTINENT HISTORY: hx of BPPV  PAIN:  Are you having pain? No  PRECAUTIONS: None  RED FLAGS: None   WEIGHT BEARING RESTRICTIONS: No  FALLS: Has patient fallen in last 6 months? No    PLOF: Independent  PATIENT GOALS: eliminate dizziness  OBJECTIVE:  Note: Objective measures were completed at Evaluation unless otherwise noted.  TREATMENT DATE:  06/23/24 Activity Comments  Dix-hallpike 1 Symptomatic on R but unable to visualize nystagmus. Lasted < 20 sec No symptoms on L  Epley maneuver x1 Slight dizziness on the way back up  Dix-hallpike 2 No symptoms so did not perform another Epley. Felt some dizziness on return to sitting  [employer] x3 Reviewed for HEP. Cues to stay on her shoulder and use a pillow under her shoulder if it feels she needs to get more neck extension   VOR x 1 horizontal and vertical x20 each Seated and then in standing  VOR x 2 horizontal and vertical x 20 each Difficulty with maintaining gaze stability/coordination with vertical motion  Vestibular ball x20 CW & CCW In standing     PATIENT EDUCATION: Education details: HEP review and updates Person educated: Patient Education method: Explanation and Handouts Education comprehension: verbalized understanding  HOME EXERCISE PROGRAM:  Access Code: [email or web address] URL: [email or web address] Date: 06/23/2024 Prepared by: Zuria Fosdick April Earnie Starring  Exercises - Walking Gaze Stabilization Head Rotation  - 1 x daily - 7 x weekly - 3 sets - 15-30 sec hold - Brandt-Daroff Vestibular Exercise  - 1 x daily - 7 x weekly - 5 reps - Gaze Stability (VOR) x2 with Static March with horizontal head turns  - 1 x daily - 7 x weekly - 3 sets - 15-30 sec hold - Gaze  Stability (VOR) x2 with Static March With Vertical Head Turns  - 1 x daily - 7 x weekly - 3 sets - 15-30 sec hold - Standing Vestibular Ball Circles  - 1 x daily - 7 x weekly - 3 sets - 15-30 sec hold  POSTURE:  No Significant postural limitations  Cervical ROM:    Active A/PROM (deg) eval  Flexion WNL  Extension WNL  Right lateral flexion WNl  Left lateral flexion WNL  Right rotation WNL  Left rotation WNL  (Blank rows = not tested)  STRENGTH:   LOWER EXTREMITY MMT:   MMT Right eval Left eval  Hip flexion    Hip abduction    Hip adduction    Hip internal rotation    Hip external rotation    Knee flexion    Knee extension    Ankle dorsiflexion    Ankle plantarflexion    Ankle inversion    Ankle eversion    (Blank rows = not tested)    GAIT: Gait pattern: WFL Distance walked:  Assistive device utilized: None Level of assistance: Complete Independence Comments:    PATIENT SURVEYS:  DHI 18/100  VESTIBULAR ASSESSMENT:  GENERAL OBSERVATION: wears glasses   SYMPTOM BEHAVIOR:  Subjective history: recurrent hx x 1.5 yr positional vertigo  Non-Vestibular symptoms: none  Type of dizziness: Imbalance (Disequilibrium) and Spinning/Vertigo  Frequency: varies, sporadic appearance  Duration: seconds-minute  Aggravating factors: Induced by position change: lying supine and rolling to the right, tipping head back  Relieving factors: Epley maneuver  Progression of symptoms: success w/ Epley maneuver, but recurring  OCULOMOTOR EXAM:  Ocular Alignment: normal  Ocular ROM: No Limitations  Spontaneous Nystagmus: absent  Gaze-Induced Nystagmus: absent  Smooth Pursuits: intact  Saccades: intact  Convergence/Divergence: 3 cm     VESTIBULAR - OCULAR REFLEX:   Slow VOR: Normal  VOR Cancellation: Normal  Head-Impulse Test: HIT Right: negative HIT Left: a few covert slips 2 of 5 reps  Dynamic Visual Acuity: Not able to be assessed   POSITIONAL TESTING: Right  Dix-Hallpike: no nystagmus Left Dix-Hallpike: no nystagmus Right Roll Test: no nystagmus Left Roll Test: no nystagmus Right Sidelying: no nystagmus Left Sidelying: no nystagmus Notes motion sensitivity w/ change of positions during testing  MOTION SENSITIVITY:  Motion Sensitivity Quotient Intensity: 0 = none, 1 = Lightheaded, 2 = Mild, 3 = Moderate, 4 = Severe, 5 = Vomiting  Intensity  1. Sitting to supine   2. Supine to L side   3. Supine to R side   4. Supine to sitting   5. L Hallpike-Dix   6. Up from L    7. R Hallpike-Dix   8. Up from R    9. Sitting, head tipped to L knee   10. Head up from L knee   11. Sitting, head tipped to R knee   12. Head up from R knee   13. Sitting head turns x5   14.Sitting head nods x5   15. In stance, 180  turn to L    16. In stance, 180 turn to R     OTHOSTATICS: not done  FUNCTIONAL GAIT: FGA TBD   M-CTSIB  Condition 1: Firm Surface, EO 30 Sec, Normal Sway  Condition 2: Firm Surface, EC 30 Sec, Mild Sway  Condition 3: Foam Surface, EO 30 Sec, Normal Sway  Condition 4: Foam Surface, EC 30 Sec, Moderate Sway                                                                                                                               GOALS: Goals reviewed with patient? Yes  SHORT TERM GOALS: Target date: same as LTG    LONG TERM GOALS: Target date: 07/26/2024    The patient will be independent with HEP for gaze adaptation, habituation, balance, and general mobility. Baseline:  Goal status: INITIAL  2.  Teach back methods for self-assessment of canalithiasis for self-mgmt Baseline:  Goal status: INITIAL  3.  Demo improved postural stability normal-mild sway x 30 sec condition 4 M-CTSIB for improved safety during ADL Baseline: moderate x 30 sec condition 4 Goal status: INITIAL  4.  DHI 0/100 for improved symptoms Baseline: 18/100 Goal status: INITIAL   ASSESSMENT:  CLINICAL IMPRESSION: Rechecked pt for BPPV. No  nystagmus noted with R Dix-hallpike in room light; however, did get symptomatic. Performed Epley x1. On recheck she didn't have any symptoms in Dix-hallpike position but did get some motion sensitivity with the return to sitting. Progressed pt to VORx2 exercises with pt having some difficulty coordinating and maintaining gaze stability in the vertical plane. Discussed another visit of PT to insure good progression at home.   From eval: Patient is a 58 y.o. lady who was seen today for physical therapy evaluation and treatment for dizziness/BPPV.  Notes a recurrent hx over past 1.5 year with successful use of Epley for self-treatment.  Oculomotor and VOR testing unrevealing. Head Impulse Test with subtle instances of corrective saccade 2/5 trials Left. Positional tests unrevealing for BPPV at this time but exhibits motion sensitivity with change of position from these movements.  M-CTSIB reveals moderate sway x 30 sec M-CTSIB implying decreased vestibular input to balance.    Initiated tx on this date with VOR in different positions/activities for habituation as well as brandt-daroff for motion sensitivity and for means of self-assessment.  Additional visits would be helpful to provide ongoing assessment and intervention   OBJECTIVE IMPAIRMENTS: decreased balance and dizziness.   ACTIVITY LIMITATIONS: bending, reach over head, and hygiene/grooming  PARTICIPATION LIMITATIONS: meal prep, cleaning, and exercise  PERSONAL FACTORS: Time since onset of injury/illness/exacerbation are also affecting patient's functional outcome.   REHAB POTENTIAL: Excellent  CLINICAL DECISION MAKING: Stable/uncomplicated  EVALUATION COMPLEXITY: Low   PLAN:  PT FREQUENCY: 1x/week or every other depending on symptoms  PT DURATION: 6 weeks  PLANNED INTERVENTIONS: 97750- Physical Performance Testing, 97110-Therapeutic exercises, 97530- Therapeutic activity, [id number]- Neuromuscular re-education, 97535- Self Care, 02859-  Manual therapy, [id number]- Gait training, and [id number]- Canalith repositioning  PLAN FOR NEXT SESSION: re-assess for BPPV as indicated, HEP review and progressions for higher level vestibular/balance activities that she can incorporate with her exercise routine (enjoys strength training, e.g. balance+strength activities)   1:59 PM, 06/23/24 Mikeria Valin April Ma L Jolisa Intriago, PT, DPT Physical Therapist- [place] Office Number: [phone]

## 2024-07-04 ENCOUNTER — Ambulatory Visit

## 2024-07-08 ENCOUNTER — Ambulatory Visit: Admitting: Physical Therapy

## 2024-07-08 ENCOUNTER — Encounter: Payer: Self-pay | Admitting: Physical Therapy

## 2024-07-08 DIAGNOSIS — R42 Dizziness and giddiness: Secondary | ICD-10-CM

## 2024-07-08 NOTE — Therapy (Signed)
 OUTPATIENT PHYSICAL THERAPY TREATMENT   Patient Name: Renee Black MRN: [id number] DOB:1965/12/16, 58 y.o., female Today's Date: 07/08/2024  END OF SESSION:  PT End of Session - 07/08/24 1203     Visit Number 3    Number of Visits 4    Date for PT Re-Evaluation 07/26/24    Authorization Type [employer]    PT Start Time 1022    PT Stop Time 1056    PT Time Calculation (min) 34 min    Activity Tolerance Patient tolerated treatment well    Behavior During Therapy WFL for tasks assessed/performed            History reviewed. No pertinent past medical history. Past Surgical History:  Procedure Laterality Date   AUGMENTATION MAMMAPLASTY Bilateral 2005   ROTATOR CUFF REPAIR     Patient Active Problem List   Diagnosis Date Noted   BPPV (benign paroxysmal positional vertigo), right 06/09/2024   Chronic vaginitis 10/08/2021   Low libido 10/08/2021   Menopause 10/08/2021   Pain in female genitalia on intercourse 10/08/2021   Subclinical hypothyroidism 10/08/2021   Vaginal dryness 10/08/2021   Low testosterone 11/23/2020   Polyp of cervix 11/23/2020   Vulvodynia 11/23/2020   Drug-induced constipation 06/29/2020   Family history of early CAD 09/07/2019   HSV-1 (herpes simplex virus 1) infection 06/25/2018   Physical exam 12/02/2017   Hyperlipidemia 12/02/2017    PCP: Mahlon Comer BRAVO, MD REFERRING PROVIDER: Jerrell Cleatus Ned, MD  REFERRING DIAG:  H81.11 (ICD-10-CM) - BPPV (benign paroxysmal positional vertigo), right    THERAPY DIAG:  Dizziness and giddiness  ONSET DATE: 1.5 years  Rationale for Evaluation and Treatment: Rehabilitation  SUBJECTIVE:   SUBJECTIVE STATEMENT: Not feeling back to normal yet.  Been doing my exercises, but still dizzy.  Rates 5/10   From eval: Issues of positional vertigo over past 1.5 year.  Has been performing self-Epley at home to good effect.  Notes positions provoking are rolling towards the right and tipping  head back for eye drops or washing hair.  Episodes last about 10-60. Denies any otalgia, no photo/phonophobia. Has hx of neck pain,but nothing lasting.  Reports she performed Epley maneuver over the weekend but seems to be about the same.  Pt accompanied by: self  PERTINENT HISTORY: hx of BPPV  PAIN:  Are you having pain? No  PRECAUTIONS: None  RED FLAGS: None   WEIGHT BEARING RESTRICTIONS: No  FALLS: Has patient fallen in last 6 months? No    PLOF: Independent  PATIENT GOALS: eliminate dizziness  OBJECTIVE:  Note: Objective measures were completed at Evaluation unless otherwise noted.   TODAY'S TREATMENT: 07/08/2024 Activity Comments  R Dix-Hallpike loaded Negative, no nystagmus and symptoms  L Dix-Hallpike loaded Negative nystagmus,  mild dizziness coming up from test  R roll Negative   L roll negative  Review Brandt-Daroff To L, x 2 reps, no symtpoms going down, 5/10 symptoms up to sit, settles <30 sec, then 6/10 To R x 1 rep, no symptoms going down, reports symptoms upon sitting up  Corner multi-sensory balance: On Airex:  feet apart EO/EC head turns/nods  UE diagonals on foam, 5 reps each direction Saccades horizontal with EO head turns, no dizziness Does have increased sway with EC head motions -no dizziness  Reviewed HEP and pt return demo (quick) understanding, rates no symptoms    HOME EXERCISE PROGRAM: Access Code: [email or web address] URL: [email or web address] Date: 07/08/2024 Prepared by: [facility] - Outpatient  Rehab - [facility]  Exercises - Walking Gaze Stabilization Head Rotation  - 1 x daily - 7 x weekly - 3 sets - 15-30 sec hold - Brandt-Daroff Vestibular Exercise  - 1 x daily - 7 x weekly - 5 reps - Gaze Stability (VOR) x2 with Static March with horizontal head turns  - 1 x daily - 7 x weekly - 3 sets - 15-30 sec hold - Gaze Stability (VOR) x2 with Static March With Vertical Head Turns  - 1 x daily - 7 x weekly - 3 sets - 15-30 sec  hold - Standing Vestibular Ball Circles  - 1 x daily - 7 x weekly - 3 sets - 15-30 sec hold - Standing on Foam Pad  - 1 x daily - 7 x weekly - 3 sets - 30 sec hold     PATIENT EDUCATION: Education details: HEP review and updates (corner balance with feet apart>progression to feet together), rationale for Brandt-Daroff and habituation Person educated: Patient Education method: [employer] Education comprehension: verbalized understanding  POSTURE:  No Significant postural limitations  Cervical ROM:    Active A/PROM (deg) eval  Flexion WNL  Extension WNL  Right lateral flexion WNl  Left lateral flexion WNL  Right rotation WNL  Left rotation WNL  (Blank rows = not tested)  STRENGTH:   LOWER EXTREMITY MMT:   MMT Right eval Left eval  Hip flexion    Hip abduction    Hip adduction    Hip internal rotation    Hip external rotation    Knee flexion    Knee extension    Ankle dorsiflexion    Ankle plantarflexion    Ankle inversion    Ankle eversion    (Blank rows = not tested)    GAIT: Gait pattern: WFL Distance walked:  Assistive device utilized: None Level of assistance: Complete Independence Comments:    PATIENT SURVEYS:  DHI 18/100  VESTIBULAR ASSESSMENT:  GENERAL OBSERVATION: wears glasses   SYMPTOM BEHAVIOR:  Subjective history: recurrent hx x 1.5 yr positional vertigo  Non-Vestibular symptoms: none  Type of dizziness: Imbalance (Disequilibrium) and Spinning/Vertigo  Frequency: varies, sporadic appearance  Duration: seconds-minute  Aggravating factors: Induced by position change: lying supine and rolling to the right, tipping head back  Relieving factors: Epley maneuver  Progression of symptoms: success w/ Epley maneuver, but recurring  OCULOMOTOR EXAM:  Ocular Alignment: normal  Ocular ROM: No Limitations  Spontaneous Nystagmus: absent  Gaze-Induced Nystagmus: absent  Smooth Pursuits: intact  Saccades:  intact  Convergence/Divergence: 3 cm     VESTIBULAR - OCULAR REFLEX:   Slow VOR: Normal  VOR Cancellation: Normal  Head-Impulse Test: HIT Right: negative HIT Left: a few covert slips 2 of 5 reps  Dynamic Visual Acuity: Not able to be assessed   POSITIONAL TESTING: Right Dix-Hallpike: no nystagmus Left Dix-Hallpike: no nystagmus Right Roll Test: no nystagmus Left Roll Test: no nystagmus Right Sidelying: no nystagmus Left Sidelying: no nystagmus Notes motion sensitivity w/ change of positions during testing  MOTION SENSITIVITY:  Motion Sensitivity Quotient Intensity: 0 = none, 1 = Lightheaded, 2 = Mild, 3 = Moderate, 4 = Severe, 5 = Vomiting  Intensity  1. Sitting to supine   2. Supine to L side   3. Supine to R side   4. Supine to sitting   5. L Hallpike-Dix   6. Up from L    7. R Hallpike-Dix   8. Up from R    9. Sitting, head tipped to L knee  10. Head up from L knee   11. Sitting, head tipped to R knee   12. Head up from R knee   13. Sitting head turns x5   14.Sitting head nods x5   15. In stance, 180 turn to L    16. In stance, 180 turn to R     OTHOSTATICS: not done  FUNCTIONAL GAIT: FGA TBD   M-CTSIB  Condition 1: Firm Surface, EO 30 Sec, Normal Sway  Condition 2: Firm Surface, EC 30 Sec, Mild Sway  Condition 3: Foam Surface, EO 30 Sec, Normal Sway  Condition 4: Foam Surface, EC 30 Sec, Moderate Sway                                                                                                                               GOALS: Goals reviewed with patient? Yes  SHORT TERM GOALS: Target date: same as LTG    LONG TERM GOALS: Target date: 07/26/2024    The patient will be independent with HEP for gaze adaptation, habituation, balance, and general mobility. Baseline:  Goal status: INITIAL  2.  Teach back methods for self-assessment of canalithiasis for self-mgmt Baseline:  Goal status: INITIAL  3.  Demo improved postural stability  normal-mild sway x 30 sec condition 4 M-CTSIB for improved safety during ADL Baseline: moderate x 30 sec condition 4 Goal status: INITIAL  4.  DHI 0/100 for improved symptoms Baseline: 18/100 Goal status: INITIAL   ASSESSMENT:  CLINICAL IMPRESSION: Pt presents today with reports of continueing to have some dizziness.  She is trying Epley at home per report in addition to her HEP.  With positional testing today, no nystagmus and no symptoms in positions, but motion sensitivity upon return to sitting.  Reviewed Brandt-Daroff, but that tends to increase her symptoms.  Worked on multi-sensory balance on foam with head motions and discussed progression to feet together for increased challenge.  She will continue to benefit from skilled PT towards goals for improved dizziness for full return to functional mobility and gym activities.   From eval: Patient is a 58 y.o. lady who was seen today for physical therapy evaluation and treatment for dizziness/BPPV.  Notes a recurrent hx over past 1.5 year with successful use of Epley for self-treatment.  Oculomotor and VOR testing unrevealing. Head Impulse Test with subtle instances of corrective saccade 2/5 trials Left. Positional tests unrevealing for BPPV at this time but exhibits motion sensitivity with change of position from these movements.  M-CTSIB reveals moderate sway x 30 sec M-CTSIB implying decreased vestibular input to balance.    Initiated tx on this date with VOR in different positions/activities for habituation as well as brandt-daroff for motion sensitivity and for means of self-assessment.  Additional visits would be helpful to provide ongoing assessment and intervention   OBJECTIVE IMPAIRMENTS: decreased balance and dizziness.   ACTIVITY LIMITATIONS: bending, reach over head, and hygiene/grooming  PARTICIPATION LIMITATIONS: meal prep, cleaning, and  exercise  PERSONAL FACTORS: Time since onset of injury/illness/exacerbation are also  affecting patient's functional outcome.   REHAB POTENTIAL: Excellent  CLINICAL DECISION MAKING: Stable/uncomplicated  EVALUATION COMPLEXITY: Low   PLAN:  PT FREQUENCY: 1x/week or every other depending on symptoms  PT DURATION: 6 weeks  PLANNED INTERVENTIONS: 97750- Physical Performance Testing, 97110-Therapeutic exercises, 97530- Therapeutic activity, [id number]- Neuromuscular re-education, 97535- Self Care, 02859- Manual therapy, [id number]- Gait training, and [phone]- Canalith repositioning  PLAN FOR NEXT SESSION: re-assess for BPPV as indicated, HEP review and progressions for higher level vestibular/balance activities that she can incorporate with her exercise routine (enjoys strength training, e.g. balance+strength activities).  Assess goals and discuss POC   Greig Anon, PT 07/08/24 12:04 PM Phone: [phone] Fax: [phone]  [facility] [address] [place], [address] [place], [place] [address] Phone # [phone] Fax # [phone]

## 2024-07-26 ENCOUNTER — Ambulatory Visit: Payer: Self-pay | Admitting: Physical Therapy

## 2024-08-02 DIAGNOSIS — E079 Disorder of thyroid, unspecified: Secondary | ICD-10-CM | POA: Insufficient documentation

## 2024-08-22 ENCOUNTER — Ambulatory Visit (INDEPENDENT_AMBULATORY_CARE_PROVIDER_SITE_OTHER): Admitting: Family Medicine

## 2024-08-22 ENCOUNTER — Encounter: Payer: Self-pay | Admitting: Family Medicine

## 2024-08-22 VITALS — BP 112/62 | HR 95 | Ht 67.0 in | Wt 154.1 lb

## 2024-08-22 DIAGNOSIS — L989 Disorder of the skin and subcutaneous tissue, unspecified: Secondary | ICD-10-CM

## 2024-08-22 DIAGNOSIS — Z23 Encounter for immunization: Secondary | ICD-10-CM

## 2024-08-22 MED ORDER — EUCRISA 2 % EX OINT: TOPICAL_OINTMENT | CUTANEOUS | 1 refills | Status: AC

## 2024-08-22 NOTE — Patient Instructions (Signed)
 Follow up as needed or as scheduled We'll notify you of your lab results and make any changes if needed APPLY the Eucrisa to the red/dry areas twice daily and then decrease to once daily once things are improving If no improvement after 2-3 weeks on Eucrisa, follow up w/ Derm Call with any questions or concerns Hang in there!

## 2024-08-22 NOTE — Progress Notes (Signed)
   Subjective:    Patient ID: Renee Black, female    DOB: 08/17/1966, 58 y.o.   MRN: [id number]  HPI Skin lesions- was dx'd w/ contact dermatitis in July by Derm.  Was given a steroid cream.  Last month had allergy testing at Derm- allergic to Nickel, hair dye, fragrance mix.  She continues to have skin lesions that appear in different areas at different times.  Pt was traveling this weekend and used different soaps.  Friends have mentioned possible autoimmune issue.   Review of Systems For ROS see HPI     Objective:   Physical Exam Vitals reviewed.  Constitutional:      General: She is not in acute distress.    Appearance: Normal appearance. She is not ill-appearing.  HENT:     Head: Normocephalic and atraumatic.  Skin:    General: Skin is warm and dry.     Findings: Erythema (macular scaling erythematous lesions along hairline, in scalp, on dorsum of hands bilaterally) present.  Neurological:     General: No focal deficit present.     Mental Status: She is alert and oriented to person, place, and time.  Psychiatric:        Mood and Affect: Mood normal.        Behavior: Behavior normal.        Thought Content: Thought content normal.           Assessment & Plan:  Skin lesions- new to provider, ongoing for pt.  Has seen derm and was dx'd w/ contact dermatitis.  Had allergy testing done and pt has been changing products to avoid triggers.  Reports lesions will improve and then flare again.  Was given a steroid cream for her face and then one to use on the rest of her body.  She has stopped using steroids b/c she was not instructed on how long to use them and she knows they can have side effects and she is concerned about thinning skin.  Also worried about possible autoimmune skin condition.  Will start topical Eucrisa for treatment of skin lesions and also get ANA to test for possible autoimmune condition.  Pt expressed understanding and is in agreement w/ plan.

## 2024-08-24 LAB — ANA: Anti Nuclear Antibody (ANA): POSITIVE — AB

## 2024-08-24 LAB — ANTI-NUCLEAR AB-TITER (ANA TITER): ANA Titer 1: 1:1280 {titer} — ABNORMAL HIGH

## 2024-08-25 ENCOUNTER — Other Ambulatory Visit: Payer: Self-pay

## 2024-08-25 ENCOUNTER — Ambulatory Visit: Payer: Self-pay | Admitting: Family Medicine

## 2024-08-25 DIAGNOSIS — R7689 Other specified abnormal immunological findings in serum: Secondary | ICD-10-CM

## 2024-08-25 DIAGNOSIS — L989 Disorder of the skin and subcutaneous tissue, unspecified: Secondary | ICD-10-CM

## 2024-08-25 NOTE — Progress Notes (Signed)
 Referral sent

## 2024-08-25 NOTE — Progress Notes (Signed)
 LVM to call office.

## 2024-08-25 NOTE — Progress Notes (Signed)
Pt has been notified about lab results.

## 2024-08-25 NOTE — Telephone Encounter (Signed)
 Copied from CRM #[id number]. Topic: General - Call Back - No Documentation >> Aug 25, 2024  9:42 AM Charolett L wrote: Reason for CRM: Patient returning office call to [place], Adv that Nurse is currently with patients and patient would like a call back

## 2024-08-27 ENCOUNTER — Encounter: Payer: Self-pay | Admitting: Family Medicine

## 2024-08-29 ENCOUNTER — Telehealth: Payer: Self-pay

## 2024-08-29 ENCOUNTER — Other Ambulatory Visit (HOSPITAL_COMMUNITY): Payer: Self-pay

## 2024-08-29 NOTE — Telephone Encounter (Signed)
 Pt needs a PA for Crisaborole (EUCRISA) 2 % OINT

## 2024-08-29 NOTE — Telephone Encounter (Signed)
 Pharmacy Patient Advocate Encounter   Received notification from Patient Advice Request messages that prior authorization for Eucrisa 2% ointment is required/requested.   Insurance verification completed.   The patient is insured through [facility].   Per test claim: PA required; PA submitted to above mentioned insurance via Latent Key/confirmation #/[facility] Status is pending

## 2024-08-30 ENCOUNTER — Other Ambulatory Visit (HOSPITAL_COMMUNITY): Payer: Self-pay

## 2024-08-30 NOTE — Telephone Encounter (Signed)
 Good morning submitted and approved

## 2024-08-30 NOTE — Telephone Encounter (Signed)
 Pharmacy Patient Advocate Encounter  Received notification from [facility] that Prior Authorization for Eucrisa 2% ointment has been APPROVED from 08/29/24 to 08/29/25. Ran test claim, Copay is $80.00. This test claim was processed through [facility]- copay amounts may vary at other pharmacies due to pharmacy/plan contracts, or as the patient moves through the different stages of their insurance plan.     PA #/Case ID/Reference #: [id number]

## 2024-08-30 NOTE — Telephone Encounter (Signed)
 Noted. Thanks.

## 2024-08-31 NOTE — Addendum Note (Signed)
 Addended by: Kobie Whidby E on: 08/31/2024 03:27 PM   Modules accepted: Orders

## 2024-09-01 NOTE — Addendum Note (Signed)
 Addended by: Markie Heffernan E on: 09/01/2024 10:18 AM   Modules accepted: Orders

## 2024-09-01 NOTE — Progress Notes (Signed)
 Referral updated to reflect pt's preference

## 2024-09-09 ENCOUNTER — Encounter: Payer: Self-pay | Admitting: Family Medicine

## 2024-09-09 NOTE — Telephone Encounter (Signed)
 Please advise if there is something we can help the patient with

## 2024-09-09 NOTE — Telephone Encounter (Signed)
 I suspect this is something that Dr. Mahlon can manage.  Will need to wait for her to return to the office to assess.

## 2024-11-09 ENCOUNTER — Other Ambulatory Visit: Payer: Self-pay | Admitting: Cardiology

## 2024-11-14 ENCOUNTER — Encounter: Payer: Self-pay | Admitting: Family Medicine

## 2024-11-14 NOTE — Telephone Encounter (Signed)
 Patient has questions about changing medication/stopping medication

## 2024-11-15 NOTE — Telephone Encounter (Signed)
Patient responded.

## 2024-11-18 NOTE — Telephone Encounter (Signed)
 Patient has sent in lab results from 10/27/2024

## 2024-11-21 ENCOUNTER — Ambulatory Visit: Admitting: Family Medicine

## 2024-11-21 ENCOUNTER — Encounter: Payer: Self-pay | Admitting: Family Medicine

## 2024-11-21 VITALS — BP 120/72 | HR 99 | Ht 67.0 in | Wt 151.1 lb

## 2024-11-21 DIAGNOSIS — F520 Hypoactive sexual desire disorder: Secondary | ICD-10-CM | POA: Diagnosis not present

## 2024-11-21 DIAGNOSIS — J069 Acute upper respiratory infection, unspecified: Secondary | ICD-10-CM | POA: Diagnosis not present

## 2024-11-21 DIAGNOSIS — Z79890 Hormone replacement therapy (postmenopausal): Secondary | ICD-10-CM | POA: Diagnosis not present

## 2024-11-21 DIAGNOSIS — R7989 Other specified abnormal findings of blood chemistry: Secondary | ICD-10-CM | POA: Diagnosis not present

## 2024-11-21 MED ORDER — ESTRADIOL 0.0375 MG/24HR TD PTWK: 0.0375 mg | MEDICATED_PATCH | TRANSDERMAL | 3 refills | Status: AC

## 2024-11-21 MED ORDER — PROGESTERONE MICRONIZED 100 MG PO CAPS: 100.0000 mg | ORAL_CAPSULE | Freq: Every day | ORAL | 1 refills | Status: AC

## 2024-11-21 MED ORDER — TESTOSTERONE 50 MG/5GM (1%) TD GEL: TRANSDERMAL | 3 refills | Status: DC

## 2024-11-21 MED ORDER — LIOTHYRONINE SODIUM 5 MCG PO TABS: 5.0000 ug | ORAL_TABLET | Freq: Every day | ORAL | 1 refills | Status: AC

## 2024-11-21 MED ORDER — LEVOTHYROXINE SODIUM 25 MCG PO TABS: 25.0000 ug | ORAL_TABLET | Freq: Every morning | ORAL | 1 refills | Status: AC

## 2024-11-21 NOTE — Patient Instructions (Signed)
 Follow up in 1 month to recheck HRT and see if adjustments need to be made IF the cost of the testosterone  gel is too high- check [employer]com and see if there are coupons I sent the thyroid  meds and the progesterone  into the pharmacy in my name I sent the Estrogen patch as well Your current symptoms appear to be viral and should improve w/ time- lots of fluids, rest, and Dayquil/Nyquil as needed Call with any questions or concerns Happy New Year!

## 2024-11-21 NOTE — Progress Notes (Unsigned)
" ° °  Subjective:    Patient ID: Renee Black, female    DOB: May 13, 1966, 59 y.o.   MRN: [id number]  HPI HRT- is wanting to start topical testosterone  tx rather than hormones.  Last pellets were inserted in July.  Currently on Prometrium  100mg  nightly.  Pt is interested in starting transdermal estrogen patch.    URI- sxs started Saturday w/ congestion and sore throat.  No fever.  + body aches and chills.  + sneezing and runny nose.  No HA.  Ears feel 'itchy'.  Denies sinus pain/pressure.  Minimal cough.   Review of Systems For ROS see HPI     Objective:   Physical Exam Vitals reviewed.  Constitutional:      General: She is not in acute distress.    Appearance: Normal appearance. She is not ill-appearing.  HENT:     Head: Normocephalic and atraumatic.     Right Ear: Tympanic membrane and ear canal normal.     Left Ear: Tympanic membrane and ear canal normal.     Nose: Congestion present.     Comments: No TTP over frontal or maxillary sinuses    Mouth/Throat:     Mouth: Mucous membranes are moist.     Pharynx: No oropharyngeal exudate or posterior oropharyngeal erythema.  Eyes:     Extraocular Movements: Extraocular movements intact.     Conjunctiva/sclera: Conjunctivae normal.  Cardiovascular:     Rate and Rhythm: Normal rate and regular rhythm.     Heart sounds: Normal heart sounds.  Pulmonary:     Effort: Pulmonary effort is normal. No respiratory distress.     Breath sounds: Normal breath sounds. No wheezing or rhonchi.  Musculoskeletal:     Cervical back: Neck supple.  Lymphadenopathy:     Cervical: No cervical adenopathy.  Skin:    General: Skin is warm and dry.  Neurological:     General: No focal deficit present.     Mental Status: She is alert and oriented to person, place, and time.  Psychiatric:        Mood and Affect: Mood normal.        Behavior: Behavior normal.        Thought Content: Thought content normal.           Assessment & Plan:  Viral  URI- new.  No evidence of bacterial infxn on PE.  No need for abx.  Reviewed supportive care and red flags that should prompt return.  Pt expressed understanding and is in agreement w/ plan.   "

## 2024-11-23 NOTE — Assessment & Plan Note (Signed)
 New to provider, ongoing for pt.  Was previously getting testosterone  pellets but would like more traditional tx.  Her recent labs show testosterone  level <3 so pellets are no longer contributing.  Will start off label dose of testosterone  gel for HSDD- 5mg  daily.  Pt expressed understanding and is in agreement w/ plan.

## 2024-11-23 NOTE — Assessment & Plan Note (Signed)
 New to provider, ongoing for pt.  She was getting testosterone  and estrogen pellets but she would prefer more conventional HRT and would like to do that here.  Will start Estradiol  patch rather than vaginal estrogen as this is her preference.  Start at 0.0375mg  and titrate prn.  Pt expressed understanding and is in agreement w/ plan.

## 2024-11-24 NOTE — Progress Notes (Unsigned)
 "  Office Visit Note  Patient: Renee Black             Date of Birth: 1966/01/05           MRN: [id number]             PCP: Mahlon Comer BRAVO, MD Referring: Mahlon Comer BRAVO, MD Visit Date: 12/08/2024 Occupation: Data Unavailable  Subjective:  No chief complaint on file.   History of Present Illness: Renee Black is a 59 y.o. female ***     Activities of Daily Living:  Patient reports morning stiffness for *** {minute/hour:19697}.   Patient {ACTIONS;DENIES/REPORTS:21021675::Denies} nocturnal pain.  Difficulty dressing/grooming: {ACTIONS;DENIES/REPORTS:21021675::Denies} Difficulty climbing stairs: {ACTIONS;DENIES/REPORTS:21021675::Denies} Difficulty getting out of chair: {ACTIONS;DENIES/REPORTS:21021675::Denies} Difficulty using hands for taps, buttons, cutlery, and/or writing: {ACTIONS;DENIES/REPORTS:21021675::Denies}  No Rheumatology ROS completed.   PMFS History:  Patient Active Problem List   Diagnosis Date Noted   Hypoactive sexual desire disorder 11/21/2024   Hormone replacement therapy 11/21/2024   Disorder of thyroid  gland 08/02/2024   BPPV (benign paroxysmal positional vertigo), right 06/09/2024   Chronic vaginitis 10/08/2021   Menopause 10/08/2021   Pain in female genitalia on intercourse 10/08/2021   Subclinical hypothyroidism 10/08/2021   Vaginal dryness 10/08/2021   Low testosterone  11/23/2020   Polyp of cervix 11/23/2020   Vulvodynia 11/23/2020   Drug-induced constipation 06/29/2020   Family history of early CAD 09/07/2019   HSV-1 (herpes simplex virus 1) infection 06/25/2018   Physical exam 12/02/2017   Hyperlipidemia 12/02/2017    No past medical history on file.  Family History  Problem Relation Age of Onset   Breast cancer Maternal Aunt    Breast cancer Maternal Grandmother    Arthritis Mother    Hypertension Mother    Cancer Father        melanoma   Cancer Maternal Grandfather        lung   Cancer Paternal Grandmother         colon   Dementia Paternal Grandfather    Heart attack Brother    Past Surgical History:  Procedure Laterality Date   AUGMENTATION MAMMAPLASTY Bilateral 2005   ROTATOR CUFF REPAIR     Social History[1] Social History   Social History Narrative   Not on file     Immunization History  Administered Date(s) Administered   Influenza Inj Mdck Quad Pf 09/24/2017   Influenza Split 08/07/2010   Influenza, Quadrivalent, Recombinant, Inj, Pf 08/22/2018, 08/16/2020   Influenza, Seasonal, Injecte, Preservative Fre 08/07/2011, 07/16/2023, 08/22/2024   Influenza,inj,Quad PF,6+ Mos 09/24/2017, 08/06/2019, 08/18/2021   Influenza-Unspecified 08/22/2018, 08/10/2021, 08/29/2022   PFIZER(Purple Top)SARS-COV-2 Vaccination 01/24/2020, 02/14/2020   PNEUMOCOCCAL CONJUGATE-20 02/15/2024   Pfizer Covid-19 Vaccine Bivalent Booster 66yrs & up 08/18/2021   Tdap 12/22/2018   Zoster Recombinant(Shingrix) 08/10/2022, 10/14/2022     Objective: Vital Signs: There were no vitals taken for this visit.   Physical Exam   Musculoskeletal Exam: ***  CDAI Exam: CDAI Score: -- Patient Global: --; Provider Global: -- Swollen: --; Tender: -- Joint Exam 12/08/2024   No joint exam has been documented for this visit   There is currently no information documented on the homunculus. Go to the Rheumatology activity and complete the homunculus joint exam.  Investigation: No additional findings.  Imaging: No results found.  Recent Labs: Lab Results  Component Value Date   WBC 7.7 02/15/2024   HGB 13.1 02/15/2024   PLT 266.0 02/15/2024   NA 137 02/15/2024   K 4.1 02/15/2024   CL  102 02/15/2024   CO2 27 02/15/2024   GLUCOSE 127 (H) 02/15/2024   BUN 15 02/15/2024   CREATININE 0.73 02/15/2024   BILITOT 0.5 02/15/2024   ALKPHOS 51 02/15/2024   AST 19 02/15/2024   ALT 15 02/15/2024   PROT 6.7 02/15/2024   ALBUMIN 4.2 02/15/2024   CALCIUM  9.2 02/15/2024    Speciality Comments: No specialty  comments available.  Procedures:  No procedures performed Allergies: Augmentin [amoxicillin-pot clavulanate], Cobalt, Codeine, Hydrocodone, Nickel, and Sulfa antibiotics   Assessment / Plan:     Visit Diagnoses: No diagnosis found.  Orders: No orders of the defined types were placed in this encounter.  No orders of the defined types were placed in this encounter.   Face-to-face time spent with patient was *** minutes. Greater than 50% of time was spent in counseling and coordination of care.  Follow-Up Instructions: No follow-ups on file.   Alfonso Patterson, LPN  Note - This record has been created using [employer].  Chart creation errors have been sought, but may not always  have been located. Such creation errors do not reflect on  the standard of medical care.    [1]  Social History Tobacco Use   Smoking status: Former    Current packs/day: 0.00    Types: Cigarettes    Quit date: 12/02/1997    Years since quitting: 26.9   Smokeless tobacco: Never   Tobacco comments:    Stopped 20 years ago  Vaping Use   Vaping status: Never Used  Substance Use Topics   Alcohol use: Yes    Comment: Wine daily.    Drug use: No   "

## 2024-11-25 ENCOUNTER — Other Ambulatory Visit: Payer: Self-pay

## 2024-12-05 ENCOUNTER — Telehealth: Payer: Self-pay

## 2024-12-05 ENCOUNTER — Encounter: Payer: Self-pay | Admitting: Family Medicine

## 2024-12-05 ENCOUNTER — Other Ambulatory Visit (HOSPITAL_COMMUNITY): Payer: Self-pay

## 2024-12-05 NOTE — Telephone Encounter (Signed)
 Is patient able to do this? She would like a paper script to pay out of pocket for medication?

## 2024-12-05 NOTE — Telephone Encounter (Signed)
 Do we have any updates on PA for medication

## 2024-12-05 NOTE — Telephone Encounter (Signed)
 Pharmacy Patient Advocate Encounter   Received notification from Patient Advice Request messages that prior authorization for Testosterone  50MG /5G GEL  is required/requested.   Insurance verification completed.   The patient is insured through [facility].  Per test claim, medication is not covered due to plan/benefit exclusion, PA not submitted at this time

## 2024-12-06 ENCOUNTER — Telehealth: Payer: Self-pay | Admitting: Family Medicine

## 2024-12-06 MED ORDER — TESTOSTERONE 50 MG/5GM (1%) TD GEL: TRANSDERMAL | 3 refills | Status: AC

## 2024-12-06 NOTE — Telephone Encounter (Signed)
 Called patient back because we received a message that her medication is not covered today. Patient is wanting a print of of the prescription to take to [facility] to use a good RX coupon. See other telephone encounters. Patient said she spoke to several people in our office but I do not see a note of that nor do I see any information from her pharmacy.I I explained that as soon as she sent her message yesterday, I contact our pharmacy team to work on her PA. I did apologize to her and she seemed okay. I told her I would speak to Dr. Mahlon about getting the print out and call her back. Patient seemed very happy with this idea.

## 2024-12-06 NOTE — Addendum Note (Signed)
 Addended by: Nycholas Rayner E on: 12/06/2024 11:05 AM   Modules accepted: Orders

## 2024-12-06 NOTE — Telephone Encounter (Signed)
 Called patient to let her know that rx is ready for pick up. She verbalized understanding and will pick up today. She did explain that she filed a complaint with her insurance company and hopes to see that through

## 2024-12-06 NOTE — Telephone Encounter (Signed)
 Copied from CRM #[id number]. Topic: Complaint (DO NOT CONVERT) - Care >> Dec 05, 2024 11:16 AM Alfonso ORN wrote: Date of Incident: 11/21/24 Details of complaint: pt has been waiting for prior authorization or update regarding rx and hasn't been able to reach anyone . She has been waiting for rx for 2 weeks and states she feels stuck How would the patient like to see it resolved? Better communication and get medication  On a scale of 1-10, how was your experience? 1 What would it take to bring it to a 10? Better communication and get medication   Route to Research Officer, Political Party.

## 2024-12-06 NOTE — Telephone Encounter (Signed)
 I have researched the chart & left a voicemail for pt to return my call to discuss this.

## 2024-12-06 NOTE — Telephone Encounter (Signed)
 I apologize for all of the hassle.  I was unaware that her medication was not covered as it never got kicked back from the pharmacy or any other indication that a prior authorization was needed.  I just saw the MyChart Message that she sent yesterday and unfortunately, there have not been any other phone notes on this issue.  I printed and signed the prescription for her.

## 2024-12-06 NOTE — Telephone Encounter (Signed)
 Please see other message about update on rx

## 2024-12-08 ENCOUNTER — Ambulatory Visit

## 2024-12-15 ENCOUNTER — Ambulatory Visit: Payer: Self-pay

## 2024-12-15 ENCOUNTER — Ambulatory Visit: Admitting: Family Medicine

## 2024-12-15 ENCOUNTER — Encounter: Payer: Self-pay | Admitting: Family Medicine

## 2024-12-15 VITALS — BP 124/60 | HR 96 | Ht 67.0 in | Wt 151.4 lb

## 2024-12-15 DIAGNOSIS — J0140 Acute pansinusitis, unspecified: Secondary | ICD-10-CM

## 2024-12-15 MED ORDER — PROMETHAZINE-DM 6.25-15 MG/5ML PO SYRP: 5.0000 mL | ORAL_SOLUTION | Freq: Four times a day (QID) | ORAL | 0 refills | Status: AC | PRN

## 2024-12-15 MED ORDER — PREDNISONE 10 MG PO TABS: ORAL_TABLET | ORAL | 0 refills | Status: AC

## 2024-12-15 NOTE — Patient Instructions (Signed)
 Follow up as needed or as scheduled START the Prednisone  as directed- 3 pills at the same time x3 days, then 2 pills at the same time x3 days, then 1 pill daily.  Take w/ food  USE the cough syrup as needed Drink LOTS of fluids Continue your allergy meds daily Call with any questions or concerns Hang In There!

## 2024-12-15 NOTE — Telephone Encounter (Signed)
 Patient has appt today.

## 2024-12-15 NOTE — Telephone Encounter (Signed)
 FYI Only or Action Required?: FYI only for provider: appointment scheduled on 12/15/24.  Patient was last seen in primary care on 11/21/2024 by Mahlon Comer BRAVO, MD.  Called Nurse Triage reporting Ear Fullness and Facial Pain.  Symptoms began about a month ago.  Interventions attempted: OTC medications: dayquil;nightquil; netti pot; motrin; flonase; anti-histamine.  Symptoms are: gradually worsening.  Triage Disposition: See HCP Within 4 Hours (Or PCP Triage)  Patient/caregiver understands and will follow disposition?: Yes   Message from Deaijah H sent at 12/15/2024 10:36 AM EST  Reason for Triage: Upper respiratory infection ; sinus infection, dry cough  congestion, clogged ears. Worsening symptom pressure in ears   Reason for Disposition  [1] SEVERE sinus pain (e.g., excruciating) AND [2] not improved 2 hours after pain medicine  Answer Assessment - Initial Assessment Questions Pt called to report worsening bilateral ear pain with congestion x 1 month. Pt states she had symptoms at appt with PCP 11/22/24 and has been doing home care as discussed with dayquil, nightquil, motril, anti-histamine HS and flonase as well as using a netti pot. Pt states that dry cough and sinus pressure seems to be worse. Pt reports pressure in bilateral ears that is starting to effect her hearing. D/t hearing decrease and ear pressure recommended in person visit for eval. Appointment scheduled for evaluation. Patient agrees with plan of care, and will call back if anything changes, or if symptoms worsen.       1. LOCATION: Where does it hurt?      Facial, eye pressure and bilateral ear pressure   2. ONSET: When did the sinus pain start?  (e.g., hours, days)      X 1 month ago; pt reports onset of symptoms around previous visit 11/22/24 but symptoms have worsened despite home care   3. SEVERITY: How bad is the pain?   (Scale 0-10; or none, mild, moderate or severe)     Moderate   4. RECURRENT  SYMPTOM: Have you ever had sinus problems before? If Yes, ask: When was the last time? and What happened that time?      No   5. NASAL CONGESTION: Is the nose blocked? If Yes, ask: Can you open it or must you breathe through your mouth?     No; pt has been using netti pot to clear mucous   7. FEVER: Do you have a fever? If Yes, ask: What is it, how was it measured, and when did it start?      Afebrile   8. OTHER SYMPTOMS: Do you have any other symptoms? (e.g., sore throat, cough, earache, difficulty breathing)     Dry cough, earache, congestion  Protocols used: Sinus Pain or Congestion-A-AH

## 2024-12-15 NOTE — Progress Notes (Signed)
" ° °  Subjective:    Patient ID: Renee Black, female    DOB: 1966/01/31, 59 y.o.   MRN: [id number]  HPI URI- sxs started 1/5 when son was home w/ febrile illness.  Pt developed cold sxs, started Zycam, and thought sxs were improving.  Week 2 developed sinus pressure and congestion.  Started DayQuil/NyQuil, Allergy pill, etc.  Week 3 continued to have congestion, sneezing, sinus pressure while in [place].  Week 4 decided to stop decongestants and Netti pot.  This week developed worsening pressure, facial swelling, ear pressure, dry cough.  Restarted DayQuil/NyQuil.  Nasal drainage is clear.  No tooth pain.     Review of Systems For ROS see HPI     Objective:   Physical Exam Vitals reviewed.  Constitutional:      General: She is not in acute distress.    Appearance: Normal appearance. She is not ill-appearing.  HENT:     Head: Normocephalic and atraumatic.     Right Ear: A middle ear effusion is present.     Left Ear: Tympanic membrane and ear canal normal.     Nose: Mucosal edema and congestion present.     Right Turbinates: Swollen.     Left Turbinates: Swollen.     Right Sinus: No maxillary sinus tenderness or frontal sinus tenderness.     Left Sinus: No maxillary sinus tenderness or frontal sinus tenderness.  Eyes:     Extraocular Movements: Extraocular movements intact.     Conjunctiva/sclera: Conjunctivae normal.  Cardiovascular:     Rate and Rhythm: Normal rate and regular rhythm.  Pulmonary:     Effort: Pulmonary effort is normal. No respiratory distress.     Breath sounds: No wheezing.  Musculoskeletal:     Cervical back: Neck supple.  Lymphadenopathy:     Cervical: No cervical adenopathy.  Skin:    General: Skin is warm and dry.  Neurological:     General: No focal deficit present.     Mental Status: She is alert and oriented to person, place, and time.  Psychiatric:        Mood and Affect: Mood normal.        Behavior: Behavior normal.        Thought Content: Thought  content normal.           Assessment & Plan:  Sinusitis- new.  Pt's sxs are consistent w/ inflammation rather than infxn.  Start prednisone  taper.  Continue daily allergy medication.  Reviewed supportive care and red flags that should prompt return.  Pt expressed understanding and is in agreement w/ plan.   "

## 2024-12-20 ENCOUNTER — Ambulatory Visit: Admitting: Family Medicine

## 2025-02-16 ENCOUNTER — Encounter: Admitting: Family Medicine
